# Patient Record
Sex: Male | Born: 1980 | Race: White | Hispanic: No | Smoking: Never smoker
Health system: Southern US, Community
[De-identification: ages and names within clinical notes are randomized; demographics above are authoritative.]

## PROBLEM LIST (undated history)

## (undated) DIAGNOSIS — T148XXA Other injury of unspecified body region, initial encounter: Secondary | ICD-10-CM

## (undated) DIAGNOSIS — R519 Headache, unspecified: Secondary | ICD-10-CM

## (undated) DIAGNOSIS — R569 Unspecified convulsions: Secondary | ICD-10-CM

## (undated) DIAGNOSIS — Z8489 Family history of other specified conditions: Secondary | ICD-10-CM

## (undated) DIAGNOSIS — R51 Headache: Secondary | ICD-10-CM

## (undated) HISTORY — PX: WISDOM TOOTH EXTRACTION: SHX21

---

## 2002-10-16 HISTORY — PX: INGUINAL HERNIA REPAIR: SUR1180

## 2012-05-29 ENCOUNTER — Encounter: Payer: Self-pay | Admitting: Family Medicine

## 2012-05-29 ENCOUNTER — Ambulatory Visit (INDEPENDENT_AMBULATORY_CARE_PROVIDER_SITE_OTHER): Payer: BC Managed Care – PPO | Admitting: Family Medicine

## 2012-05-29 VITALS — BP 136/92 | HR 88 | Ht 68.0 in | Wt 160.0 lb

## 2012-05-29 DIAGNOSIS — Z0000 Encounter for general adult medical examination without abnormal findings: Secondary | ICD-10-CM

## 2012-05-29 NOTE — Patient Instructions (Addendum)
Health Maintenance, Males A healthy lifestyle and preventative care can promote health and wellness.  Maintain regular health, dental, and eye exams.   Eat a healthy diet. Foods like vegetables, fruits, whole grains, low-fat dairy products, and lean protein foods contain the nutrients you need without too many calories. Decrease your intake of foods high in solid fats, added sugars, and salt. Get information about a proper diet from your caregiver, if necessary.   Regular physical exercise is one of the most important things you can do for your health. Most adults should get at least 150 minutes of moderate-intensity exercise (any activity that increases your heart rate and causes you to sweat) each week. In addition, most adults need muscle-strengthening exercises on 2 or more days a week.    Maintain a healthy weight. The body mass index (BMI) is a screening tool to identify possible weight problems. It provides an estimate of body fat based on height and weight. Your caregiver can help determine your BMI, and can help you achieve or maintain a healthy weight. For adults 20 years and older:   A BMI below 18.5 is considered underweight.   A BMI of 18.5 to 24.9 is normal.   A BMI of 25 to 29.9 is considered overweight.   A BMI of 30 and above is considered obese.   Maintain normal blood lipids and cholesterol by exercising and minimizing your intake of saturated fat. Eat a balanced diet with plenty of fruits and vegetables. Blood tests for lipids and cholesterol should begin at age 20 and be repeated every 5 years. If your lipid or cholesterol levels are high, you are over 50, or you are a high risk for heart disease, you may need your cholesterol levels checked more frequently.Ongoing high lipid and cholesterol levels should be treated with medicines, if diet and exercise are not effective.   If you smoke, find out from your caregiver how to quit. If you do not use tobacco, do not start.    If you choose to drink alcohol, do not exceed 2 drinks per day. One drink is considered to be 12 ounces (355 mL) of beer, 5 ounces (148 mL) of wine, or 1.5 ounces (44 mL) of liquor.   Avoid use of street drugs. Do not share needles with anyone. Ask for help if you need support or instructions about stopping the use of drugs.   High blood pressure causes heart disease and increases the risk of stroke. Blood pressure should be checked at least every 1 to 2 years. Ongoing high blood pressure should be treated with medicines if weight loss and exercise are not effective.   If you are 45 to 29 years old, ask your caregiver if you should take aspirin to prevent heart disease.   Diabetes screening involves taking a blood sample to check your fasting blood sugar level. This should be done once every 3 years, after age 45, if you are within normal weight and without risk factors for diabetes. Testing should be considered at a younger age or be carried out more frequently if you are overweight and have at least 1 risk factor for diabetes.   Colorectal cancer can be detected and often prevented. Most routine colorectal cancer screening begins at the age of 50 and continues through age 75. However, your caregiver may recommend screening at an earlier age if you have risk factors for colon cancer. On a yearly basis, your caregiver may provide home test kits to check for hidden   blood in the stool. Use of a small camera at the end of a tube, to directly examine the colon (sigmoidoscopy or colonoscopy), can detect the earliest forms of colorectal cancer. Talk to your caregiver about this at age 50, when routine screening begins. Direct examination of the colon should be repeated every 5 to 10 years through age 75, unless early forms of pre-cancerous polyps or small growths are found.   Hepatitis C blood testing is recommended for all people born from 1945 through 1965 and any individual with known risks for  hepatitis C.   Healthy men should no longer receive prostate-specific antigen (PSA) blood tests as part of routine cancer screening. Consult with your caregiver about prostate cancer screening.   Testicular cancer screening is not recommended for adolescents or adult males who have no symptoms. Screening includes self-exam, caregiver exam, and other screening tests. Consult with your caregiver about any symptoms you have or any concerns you have about testicular cancer.   Practice safe sex. Use condoms and avoid high-risk sexual practices to reduce the spread of sexually transmitted infections (STIs).   Use sunscreen with a sun protection factor (SPF) of 30 or greater. Apply sunscreen liberally and repeatedly throughout the day. You should seek shade when your shadow is shorter than you. Protect yourself by wearing long sleeves, pants, a wide-brimmed hat, and sunglasses year round, whenever you are outdoors.   Notify your caregiver of new moles or changes in moles, especially if there is a change in shape or color. Also notify your caregiver if a mole is larger than the size of a pencil eraser.   A one-time screening for abdominal aortic aneurysm (AAA) and surgical repair of large AAAs by sound wave imaging (ultrasonography) is recommended for ages 65 to 75 years who are current or former smokers.   Stay current with your immunizations.  Document Released: 03/30/2008 Document Revised: 09/21/2011 Document Reviewed: 02/27/2011 [employer] Patient Information 2012 [employer]. 

## 2012-05-29 NOTE — Progress Notes (Signed)
Patient ID: Shaun Wells, male   DOB: [birth year], 31 y.o.   MRN: [id number] SUBJECTIVE:  Shaun Wells is a 31 y.o. male presenting for his annual checkup. No current outpatient prescriptions on file.   Allergies: Penicillins   ROS:  Feeling well. No dyspnea or chest pain on exertion. No abdominal pain, change in bowel habits, black or bloody stools. No urinary tract or prostatic symptoms. No neurological complaints.  OBJECTIVE:  The patient appears well, alert, oriented x 3, in no distress.  BP 136/92  Pulse 88  Ht 5\' 8"  (1.727 m)  Wt 160 lb (72.576 kg)  BMI 24.33 kg/m2 ENT normal.  Neck supple. No adenopathy or thyromegaly. PERLA.  Lungs are clear, good air entry, no wheezes, rhonchi or rales.  Heart: S1 and S2 normal, no murmurs, regular rate and rhythm. Abdomen is soft without tenderness, guarding, mass or organomegaly.   Extremities show no edema, normal peripheral pulses.  Neurological is normal without focal findings.  ASSESSMENT:  healthy adult male  PLAN:  follow a low fat, low cholesterol diet, reduce salt in diet and cooking, continue current healthy lifestyle patterns and return for routine annual checkups

## 2012-05-31 ENCOUNTER — Ambulatory Visit: Payer: Self-pay | Admitting: Family Medicine

## 2013-06-30 ENCOUNTER — Ambulatory Visit (INDEPENDENT_AMBULATORY_CARE_PROVIDER_SITE_OTHER): Payer: BC Managed Care – PPO | Admitting: Family Medicine

## 2013-06-30 ENCOUNTER — Encounter: Payer: Self-pay | Admitting: Family Medicine

## 2013-06-30 VITALS — BP 130/80 | HR 78 | Temp 98.0°F | Ht 68.0 in | Wt 162.0 lb

## 2013-06-30 DIAGNOSIS — J069 Acute upper respiratory infection, unspecified: Secondary | ICD-10-CM

## 2013-06-30 DIAGNOSIS — R059 Cough, unspecified: Secondary | ICD-10-CM

## 2013-06-30 DIAGNOSIS — R05 Cough: Secondary | ICD-10-CM

## 2013-06-30 MED ORDER — FLUTICASONE PROPIONATE 50 MCG/ACT NA SUSP: 2.0000 | Freq: Every day | NASAL | Status: DC

## 2013-06-30 MED ORDER — BENZONATATE 100 MG PO CAPS: 100.0000 mg | ORAL_CAPSULE | Freq: Two times a day (BID) | ORAL | Status: DC | PRN

## 2013-06-30 NOTE — Progress Notes (Signed)
Patient ID: Shaun Wells    DOB: 1980/11/03, 32 y.o.   MRN: [id number] --- Subjective:  Shaun Wells is a 32 y.o.male who presents for same day visit for evaluation of cough:   - cough: started Friday a week ago, associated at the time with fever of 101.2. Fever resolved, but cough persisted. It is a productive cough of clear and green sputum, worst at night time when he lays back. He has not had any new fevers in the last few days. He denies any shortness of breath. He has a history of allergies and has had more congestion, rhinorrhea and itchy eyes recently. He had a frontal headache on Saturday but it has since resolved.  His son was sick with an ear infection 2 weeks ago. His wife was sick with a viral infection last week. She is since better.  He has tried benadryl which helped decongest his nasal passages. He took a coughing medicine which made him sleep but did not take the cough away.   ROS: see HPI Past Medical History: reviewed and updated medications and allergies. Social History: Tobacco: none  Objective: Filed Vitals:   06/30/13 1355  BP: 130/80  Pulse: 78  Temp: 98 F (36.7 C)    Physical Examination:   General appearance - alert, well appearing, and in no distress Ears - bilateral TM's and external ear canals normal Nose - mildly erythematous and congested nasal turbinates bilaterally Mouth - post nasal drip present Neck - supple, no significant adenopathy Chest - clear to auscultation, no wheezes, rales or rhonchi, symmetric air entry Heart - normal rate, regular rhythm, normal S1, S2, no murmurs

## 2013-06-30 NOTE — Patient Instructions (Addendum)
For the congestion, you can take afrin spray for 2 days. You can also take claritin for the allergies. I am sending a spray called flonase for allergies.  I am also sending a medicine called tessalon perles for the cough.    Upper Respiratory Infection, Adult An upper respiratory infection (URI) is also sometimes known as the common cold. The upper respiratory tract includes the nose, sinuses, throat, trachea, and bronchi. Bronchi are the airways leading to the lungs. Most people improve within 1 week, but symptoms can last up to 2 weeks. A residual cough may last even longer.  CAUSES Many different viruses can infect the tissues lining the upper respiratory tract. The tissues become irritated and inflamed and often become very moist. Mucus production is also common. A cold is contagious. You can easily spread the virus to others by oral contact. This includes kissing, sharing a glass, coughing, or sneezing. Touching your mouth or nose and then touching a surface, which is then touched by another person, can also spread the virus. SYMPTOMS  Symptoms typically develop 1 to 3 days after you come in contact with a cold virus. Symptoms vary from person to person. They may include:  Runny nose.  Sneezing.  Nasal congestion.  Sinus irritation.  Sore throat.  Loss of voice (laryngitis).  Cough.  Fatigue.  Muscle aches.  Loss of appetite.  Headache.  Low-grade fever. DIAGNOSIS  You might diagnose your own cold based on familiar symptoms, since most people get a cold 2 to 3 times a year. Your caregiver can confirm this based on your exam. Most importantly, your caregiver can check that your symptoms are not due to another disease such as strep throat, sinusitis, pneumonia, asthma, or epiglottitis. Blood tests, throat tests, and X-rays are not necessary to diagnose a common cold, but they may sometimes be helpful in excluding other more serious diseases. Your caregiver will decide if any  further tests are required. RISKS AND COMPLICATIONS  You may be at risk for a more severe case of the common cold if you smoke cigarettes, have chronic heart disease (such as heart failure) or lung disease (such as asthma), or if you have a weakened immune system. The very young and very old are also at risk for more serious infections. Bacterial sinusitis, middle ear infections, and bacterial pneumonia can complicate the common cold. The common cold can worsen asthma and chronic obstructive pulmonary disease (COPD). Sometimes, these complications can require emergency medical care and may be life-threatening. PREVENTION  The best way to protect against getting a cold is to practice good hygiene. Avoid oral or hand contact with people with cold symptoms. Wash your hands often if contact occurs. There is no clear evidence that vitamin C, vitamin E, echinacea, or exercise reduces the chance of developing a cold. However, it is always recommended to get plenty of rest and practice good nutrition. TREATMENT  Treatment is directed at relieving symptoms. There is no cure. Antibiotics are not effective, because the infection is caused by a virus, not by bacteria. Treatment may include:  Increased fluid intake. Sports drinks offer valuable electrolytes, sugars, and fluids.  Breathing heated mist or steam (vaporizer or shower).  Eating chicken soup or other clear broths, and maintaining good nutrition.  Getting plenty of rest.  Using gargles or lozenges for comfort.  Controlling fevers with ibuprofen or acetaminophen as directed by your caregiver.  Increasing usage of your inhaler if you have asthma. Zinc gel and zinc lozenges, taken in  the first 24 hours of the common cold, can shorten the duration and lessen the severity of symptoms. Pain medicines may help with fever, muscle aches, and throat pain. A variety of non-prescription medicines are available to treat congestion and runny nose. Your caregiver  can make recommendations and may suggest nasal or lung inhalers for other symptoms.  HOME CARE INSTRUCTIONS   Only take over-the-counter or prescription medicines for pain, discomfort, or fever as directed by your caregiver.  Use a warm mist humidifier or inhale steam from a shower to increase air moisture. This may keep secretions moist and make it easier to breathe.  Drink enough water and fluids to keep your urine clear or pale yellow.  Rest as needed.  Return to work when your temperature has returned to normal or as your caregiver advises. You may need to stay home longer to avoid infecting others. You can also use a face mask and careful hand washing to prevent spread of the virus. SEEK MEDICAL CARE IF:   After the first few days, you feel you are getting worse rather than better.  You need your caregiver's advice about medicines to control symptoms.  You develop chills, worsening shortness of breath, or brown or red sputum. These may be signs of pneumonia.  You develop yellow or brown nasal discharge or pain in the face, especially when you bend forward. These may be signs of sinusitis.  You develop a fever, swollen neck glands, pain with swallowing, or white areas in the back of your throat. These may be signs of strep throat. SEEK IMMEDIATE MEDICAL CARE IF:   You have a fever.  You develop severe or persistent headache, ear pain, sinus pain, or chest pain.  You develop wheezing, a prolonged cough, cough up blood, or have a change in your usual mucus (if you have chronic lung disease).  You develop sore muscles or a stiff neck. Document Released: 03/28/2001 Document Revised: 12/25/2011 Document Reviewed: 02/03/2011 [facility] Patient Information 2014 [place], [place].

## 2013-07-01 DIAGNOSIS — J069 Acute upper respiratory infection, unspecified: Secondary | ICD-10-CM | POA: Insufficient documentation

## 2013-07-01 NOTE — Assessment & Plan Note (Addendum)
Cough and congestion likely from upper respiratory virus. No evidence of bacterial infection.  - symptomatic treatment: treat cough with tessalon perles - congestion with afrin x2 days  - there also appears to be a component of allergies: start claritin and flonase

## 2014-02-12 ENCOUNTER — Ambulatory Visit (INDEPENDENT_AMBULATORY_CARE_PROVIDER_SITE_OTHER): Payer: BC Managed Care – PPO | Admitting: Family Medicine

## 2014-02-12 ENCOUNTER — Encounter: Payer: Self-pay | Admitting: Family Medicine

## 2014-02-12 VITALS — BP 152/88 | HR 85 | Temp 97.9°F | Wt 161.0 lb

## 2014-02-12 DIAGNOSIS — J0110 Acute frontal sinusitis, unspecified: Secondary | ICD-10-CM

## 2014-02-12 MED ORDER — AMOXICILLIN-POT CLAVULANATE 875-125 MG PO TABS: 1.0000 | ORAL_TABLET | Freq: Two times a day (BID) | ORAL | Status: DC

## 2014-02-12 NOTE — Progress Notes (Signed)
Subjective:     Patient ID: Shaun Wells, male   DOB: 12/03/1980, 33 y.o.   MRN: [id number]  HPI   - woke up Monday feeling badly with body aches and chills and had a temp to 101. Took tylenol and it went away - this AM woke up with chills, aches and sweats and temp of 101. Took tylenol and again fever resolved.   - has been having approx 2 weeks of sinus congestion - some headaches  - no tick exposures, tb exposures - his son has had similar symptoms last week. Was told he had a sinus infection and was placed on abx and now getting better.   No cough, nausea, vomiting, sob, cp, diarrhea, constipation.   Review of Systems See above    Objective:   Physical Exam  Constitutional: He appears well-developed and well-nourished.  HENT:  Head: Normocephalic and atraumatic.  Right Ear: Tympanic membrane is bulging (no effusion). No middle ear effusion.  Left Ear: Tympanic membrane is bulging (no effusion).  No middle ear effusion.  Nose: Mucosal edema present. Right sinus exhibits no maxillary sinus tenderness and no frontal sinus tenderness. Left sinus exhibits no maxillary sinus tenderness and no frontal sinus tenderness.  Mouth/Throat: Mucous membranes are normal. Posterior oropharyngeal erythema present. No posterior oropharyngeal edema.  Skin: He is not diaphoretic.       Assessment:     Sinusitis, acute frontal       Plan:     - currently likely viral infection based on sick contacts, duration of symptoms and overall feeling of well being.  - however, now two isolated fevers and symptoms that seem to be worsening - supportive care encouraged - rx for augmentin printed to take if symptoms do not improve or worsen over the next 3 days or fevers persist - chart states allergy to PCN however has taken amoxicillin with no difficulty in the past.    Vale HavenKeli L Henny Strauch, MD

## 2014-02-12 NOTE — Patient Instructions (Signed)

## 2016-01-25 DIAGNOSIS — F4321 Adjustment disorder with depressed mood: Secondary | ICD-10-CM | POA: Diagnosis not present

## 2016-02-04 DIAGNOSIS — F4321 Adjustment disorder with depressed mood: Secondary | ICD-10-CM | POA: Diagnosis not present

## 2016-03-03 DIAGNOSIS — F4323 Adjustment disorder with mixed anxiety and depressed mood: Secondary | ICD-10-CM | POA: Diagnosis not present

## 2016-03-24 DIAGNOSIS — F4321 Adjustment disorder with depressed mood: Secondary | ICD-10-CM | POA: Diagnosis not present

## 2016-03-27 DIAGNOSIS — R0789 Other chest pain: Secondary | ICD-10-CM | POA: Diagnosis not present

## 2016-08-11 DIAGNOSIS — H209 Unspecified iridocyclitis: Secondary | ICD-10-CM | POA: Diagnosis not present

## 2016-12-01 ENCOUNTER — Telehealth: Payer: Self-pay | Admitting: Family Medicine

## 2016-12-01 NOTE — Telephone Encounter (Signed)
Patient was last seen 01/2017. Overdue HM: tdap, HIV screening, flu vaccine.  Patient would like to complete a physical. He was unable to do so last time as he was unaware of having to abstain from all foods and liquids.  Patient described recent head injury at work with little follow-up other than standard cleaning and bandage.  Appointment scheduled for 12/20/2016 at 9am.

## 2016-12-20 ENCOUNTER — Ambulatory Visit (INDEPENDENT_AMBULATORY_CARE_PROVIDER_SITE_OTHER): Payer: BLUE CROSS/BLUE SHIELD | Admitting: Family Medicine

## 2016-12-20 ENCOUNTER — Encounter: Payer: Self-pay | Admitting: Family Medicine

## 2016-12-20 VITALS — BP 108/72 | HR 68 | Temp 98.3°F | Ht 70.0 in | Wt 169.0 lb

## 2016-12-20 DIAGNOSIS — G8929 Other chronic pain: Secondary | ICD-10-CM | POA: Diagnosis not present

## 2016-12-20 DIAGNOSIS — M25562 Pain in left knee: Secondary | ICD-10-CM

## 2016-12-20 DIAGNOSIS — M25561 Pain in right knee: Secondary | ICD-10-CM | POA: Diagnosis not present

## 2016-12-20 DIAGNOSIS — Z20828 Contact with and (suspected) exposure to other viral communicable diseases: Secondary | ICD-10-CM

## 2016-12-20 DIAGNOSIS — Z0000 Encounter for general adult medical examination without abnormal findings: Secondary | ICD-10-CM | POA: Insufficient documentation

## 2016-12-20 DIAGNOSIS — Z23 Encounter for immunization: Secondary | ICD-10-CM | POA: Diagnosis not present

## 2016-12-20 LAB — LIPID PANEL
CHOL/HDL RATIO: 4.1 ratio (ref <5.0)
Cholesterol: 163 mg/dL (ref <200)
HDL: 40 mg/dL — AB (ref >40)
LDL CALC: 102 mg/dL — AB (ref <100)
Triglycerides: 107 mg/dL (ref <150)
VLDL: 21 mg/dL (ref <30)

## 2016-12-20 LAB — BASIC METABOLIC PANEL WITH GFR
BUN: 15 mg/dL (ref 7–25)
CHLORIDE: 105 mmol/L (ref 98–110)
CO2: 29 mmol/L (ref 20–31)
Calcium: 9.2 mg/dL (ref 8.6–10.3)
Creat: 0.8 mg/dL (ref 0.60–1.35)
GFR, Est African American: >89 mL/min (ref >=60)
GFR, Est Non African American: >89 mL/min (ref >=60)
GLUCOSE: 94 mg/dL (ref 65–99)
POTASSIUM: 4 mmol/L (ref 3.5–5.3)
Sodium: 140 mmol/L (ref 135–146)

## 2016-12-20 LAB — TSH: TSH: 0.93 m[IU]/L (ref 0.40–4.50)

## 2016-12-20 LAB — CBC
HCT: 46.3 % (ref 38.5–50.0)
Hemoglobin: 15.8 g/dL (ref 13.2–17.1)
MCH: 30.7 pg (ref 27.0–33.0)
MCHC: 34.1 g/dL (ref 32.0–36.0)
MCV: 89.9 fL (ref 80.0–100.0)
MPV: 10.3 fL (ref 7.5–12.5)
Platelets: 205 10*3/uL (ref 140–400)
RBC: 5.15 MIL/uL (ref 4.20–5.80)
RDW: 13.4 % (ref 11.0–15.0)
WBC: 4.6 10*3/uL (ref 3.8–10.8)

## 2016-12-20 NOTE — Progress Notes (Signed)
Mickie HillierIan Maxen Rowland, MD, MS Phone: [phone]  Subjective:  CC -- Annual Physical;  Pt reports he feels well overall. He has had some bilateral knee pain that is not inhibit his activities of daily living. He states that this knee pain is achy in nature. It feels "deep". Pain does not radiate. Pain is exacerbated while kneeling or crouching. 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 Stairs do not make this pain any worse. He denies any mechanical symptoms. He denies any previous injuries to his knees. Denies any swelling or erythema.  Cardiovascular: - Dx Hypertension: no  - Dx Hyperlipidemia: no - Dx Obesity: no  - Physical Activity: no, Work is a lot of physical activity.   - Diabetes: no   Cancer: Colorectal >> Colonoscopy: no  Lung >> Tobacco Use: no  - But chews tobacco >> been doing this for 6-7 years.   - If so, previous Low-Dose CT screen: no  Prostate >> Interested in DRE and/or PSA: no  Skin >> Suspicious lesions: no   Social: Alcohol Use: no  Tobacco Use: no   - Interested in Quitting: n/a  Other Drugs: no  Risky Sexual Behavior: no  Depression: no   - PHQ9 score: 0 Support and Life at Home: yes   Other: Osteoporosis: no  Zoster Vaccine: no Flu Vaccine: no, declined  Pneumonia Vaccine: no   ROS- denies recent fevers, chills, headache, blurred vision, lightheadedness, dizziness, dysphagia, chest pain, shortness of breath, nausea, vomiting, diarrhea, numbness, weakness, or paresthesias.  Past Medical History Patient Active Problem List   Diagnosis Date Noted  . Preventative health care 12/20/2016  . Bilateral chronic knee pain 12/20/2016    Medications- reviewed and updated No current outpatient prescriptions on file.   No current facility-administered medications for this visit.     Objective: BP 108/72   Pulse 68   Temp 98.3 F (36.8 C) (Oral)   Ht 5\' 10"  (1.778 m)   Wt 169 lb (76.7 kg)   SpO2 98%    BMI 24.25 kg/m  Gen: NAD, alert, cooperative with exam HEENT: NCAT, EOMI, PERRL CV: RRR, good S1/S2, no murmur Resp: CTABL, no wheezes, non-labored Abd: Soft, Non Tender, Non Distended, BS present, no guarding or organomegaly Ext: No edema, warm Neuro: Alert and oriented, No gross deficits   Assessment/Plan:  Preventative health care Patient is here for an annual physical. He states that he has been doing well. He occasionally has some bilateral knee pain. 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 Pain does not inhibit his activities of daily living. - Discussed regular exercise. - Patient chews tobacco; counseled patient on quitting this habit. Patient stated his understanding and said he would likely do this in the near future. - Follow-up one year - Labs obtained today.  Bilateral chronic knee pain Minimal symptoms at this time. Intermittent duration. No significant effect on his daily life. Patient's occupation puts him at a greater risk for patellar osteoarthritis due to regular crouching and kneeling for prolonged periods of time during his workday. - Discussed Aleve as needed for pain - Discussed ice as needed - Kneepads during his workday. - Quad strengthening may benefit him long-term.   Orders Placed This Encounter  Procedures  . Tdap vaccine greater than or equal to 7yo IM  . HIV antibody  . CBC  . BASIC METABOLIC PANEL WITH GFR  . TSH  . Lipid panel    Melanee SpryIan  Sunny Schlein, MD,MS,  PGY3 12/20/2016 5:50 PM

## 2016-12-20 NOTE — Assessment & Plan Note (Signed)
Minimal symptoms at this time. Intermittent duration. No significant effect on his daily life. Patient's occupation puts him at a greater risk for patellar osteoarthritis due to regular crouching and kneeling for prolonged periods of time during his workday. - Discussed Aleve as needed for pain - Discussed ice as needed - Kneepads during his workday. - Quad strengthening may benefit him long-term.

## 2016-12-20 NOTE — Assessment & Plan Note (Signed)
Patient is here for an annual physical. He states that he has been doing well. He occasionally has some bilateral knee pain. 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 Pain does not inhibit his activities of daily living. - Discussed regular exercise. - Patient chews tobacco; counseled patient on quitting this habit. Patient stated his understanding and said he would likely do this in the near future. - Follow-up one year - Labs obtained today.

## 2016-12-20 NOTE — Patient Instructions (Signed)
 Health Maintenance, Male A healthy lifestyle and preventive care is important for your health and wellness. Ask your health care provider about what schedule of regular examinations is right for you. What should I know about weight and diet?  Eat a Healthy Diet  Eat plenty of vegetables, fruits, whole grains, low-fat dairy products, and lean protein.  Do not eat a lot of foods high in solid fats, added sugars, or salt. Maintain a Healthy Weight  Regular exercise can help you achieve or maintain a healthy weight. You should:  Do at least 150 minutes of exercise each week. The exercise should increase your heart rate and make you sweat (moderate-intensity exercise).  Do strength-training exercises at least twice a week. Watch Your Levels of Cholesterol and Blood Lipids  Have your blood tested for lipids and cholesterol every 5 years starting at 35 years of age. If you are at high risk for heart disease, you should start having your blood tested when you are 34 years old. You may need to have your cholesterol levels checked more often if:  Your lipid or cholesterol levels are high.  You are older than 34 years of age.  You are at high risk for heart disease. What should I know about cancer screening? Many types of cancers can be detected early and may often be prevented. Lung Cancer  You should be screened every year for lung cancer if:  You are a current smoker who has smoked for at least 30 years.  You are a former smoker who has quit within the past 15 years.  Talk to your health care provider about your screening options, when you should start screening, and how often you should be screened. Colorectal Cancer  Routine colorectal cancer screening usually begins at 34 years of age and should be repeated every 5-10 years until you are 34 years old. You may need to be screened more often if early forms of precancerous polyps or small growths are found. Your health care provider  may recommend screening at an earlier age if you have risk factors for colon cancer.  Your health care provider may recommend using home test kits to check for hidden blood in the stool.  A small camera at the end of a tube can be used to examine your colon (sigmoidoscopy or colonoscopy). This checks for the earliest forms of colorectal cancer. Prostate and Testicular Cancer  Depending on your age and overall health, your health care provider may do certain tests to screen for prostate and testicular cancer.  Talk to your health care provider about any symptoms or concerns you have about testicular or prostate cancer. Skin Cancer  Check your skin from head to toe regularly.  Tell your health care provider about any new moles or changes in moles, especially if:  There is a change in a mole's size, shape, or color.  You have a mole that is larger than a pencil eraser.  Always use sunscreen. Apply sunscreen liberally and repeat throughout the day.  Protect yourself by wearing long sleeves, pants, a wide-brimmed hat, and sunglasses when outside. What should I know about heart disease, diabetes, and high blood pressure?  If you are 18-39 years of age, have your blood pressure checked every 3-5 years. If you are 40 years of age or older, have your blood pressure checked every year. You should have your blood pressure measured twice-once when you are at a hospital or clinic, and once when you are not at   a hospital or clinic. Record the average of the two measurements. To check your blood pressure when you are not at a hospital or clinic, you can use:  An automated blood pressure machine at a pharmacy.  A home blood pressure monitor.  Talk to your health care provider about your target blood pressure.  If you are between 45-79 years old, ask your health care provider if you should take aspirin to prevent heart disease.  Have regular diabetes screenings by checking your fasting blood sugar  level.  If you are at a normal weight and have a low risk for diabetes, have this test once every three years after the age of 45.  If you are overweight and have a high risk for diabetes, consider being tested at a younger age or more often.  A one-time screening for abdominal aortic aneurysm (AAA) by ultrasound is recommended for men aged 65-75 years who are current or former smokers. What should I know about preventing infection? Hepatitis B  If you have a higher risk for hepatitis B, you should be screened for this virus. Talk with your health care provider to find out if you are at risk for hepatitis B infection. Hepatitis C  Blood testing is recommended for:  Everyone born from 1945 through 1965.  Anyone with known risk factors for hepatitis C. Sexually Transmitted Diseases (STDs)  You should be screened each year for STDs including gonorrhea and chlamydia if:  You are sexually active and are younger than 34 years of age.  You are older than 34 years of age and your health care provider tells you that you are at risk for this type of infection.  Your sexual activity has changed since you were last screened and you are at an increased risk for chlamydia or gonorrhea. Ask your health care provider if you are at risk.  Talk with your health care provider about whether you are at high risk of being infected with HIV. Your health care provider may recommend a prescription medicine to help prevent HIV infection. What else can I do?  Schedule regular health, dental, and eye exams.  Stay current with your vaccines (immunizations).  Do not use any tobacco products, such as cigarettes, chewing tobacco, and e-cigarettes. If you need help quitting, ask your health care provider.  Limit alcohol intake to no more than 2 drinks per day. One drink equals 12 ounces of beer, 5 ounces of wine, or 1 ounces of hard liquor.  Do not use street drugs.  Do not share needles.  Ask your health  care provider for help if you need support or information about quitting drugs.  Tell your health care provider if you often feel depressed.  Tell your health care provider if you have ever been abused or do not feel safe at home. This information is not intended to replace advice given to you by your health care provider. Make sure you discuss any questions you have with your health care provider. Document Released: 03/30/2008 Document Revised: 05/31/2016 Document Reviewed: 07/06/2015 Elsevier Interactive Patient Education  2017 Elsevier Inc.  

## 2016-12-21 LAB — HIV ANTIBODY (ROUTINE TESTING W REFLEX): HIV 1&2 Ab, 4th Generation: NONREACTIVE

## 2017-01-01 ENCOUNTER — Encounter: Payer: Self-pay | Admitting: Family Medicine

## 2017-10-19 DIAGNOSIS — H16293 Other keratoconjunctivitis, bilateral: Secondary | ICD-10-CM | POA: Diagnosis not present

## 2017-12-26 DIAGNOSIS — J069 Acute upper respiratory infection, unspecified: Secondary | ICD-10-CM | POA: Diagnosis not present

## 2018-04-08 DIAGNOSIS — S52612K Displaced fracture of left ulna styloid process, subsequent encounter for closed fracture with nonunion: Secondary | ICD-10-CM | POA: Diagnosis not present

## 2018-04-08 DIAGNOSIS — S6992XA Unspecified injury of left wrist, hand and finger(s), initial encounter: Secondary | ICD-10-CM | POA: Diagnosis not present

## 2018-04-08 DIAGNOSIS — S52612A Displaced fracture of left ulna styloid process, initial encounter for closed fracture: Secondary | ICD-10-CM | POA: Diagnosis not present

## 2018-04-08 DIAGNOSIS — M25532 Pain in left wrist: Secondary | ICD-10-CM | POA: Diagnosis not present

## 2018-04-08 DIAGNOSIS — M50821 Other cervical disc disorders at C4-C5 level: Secondary | ICD-10-CM | POA: Diagnosis not present

## 2018-04-08 DIAGNOSIS — M542 Cervicalgia: Secondary | ICD-10-CM | POA: Diagnosis not present

## 2018-04-09 DIAGNOSIS — S161XXA Strain of muscle, fascia and tendon at neck level, initial encounter: Secondary | ICD-10-CM | POA: Diagnosis not present

## 2018-04-09 DIAGNOSIS — M25532 Pain in left wrist: Secondary | ICD-10-CM | POA: Diagnosis not present

## 2018-04-22 DIAGNOSIS — S40011A Contusion of right shoulder, initial encounter: Secondary | ICD-10-CM | POA: Diagnosis not present

## 2018-04-22 DIAGNOSIS — M9907 Segmental and somatic dysfunction of upper extremity: Secondary | ICD-10-CM | POA: Diagnosis not present

## 2018-04-22 DIAGNOSIS — M9901 Segmental and somatic dysfunction of cervical region: Secondary | ICD-10-CM | POA: Diagnosis not present

## 2018-04-22 DIAGNOSIS — G8911 Acute pain due to trauma: Secondary | ICD-10-CM | POA: Diagnosis not present

## 2018-04-22 DIAGNOSIS — M40292 Other kyphosis, cervical region: Secondary | ICD-10-CM | POA: Diagnosis not present

## 2018-04-22 DIAGNOSIS — M62830 Muscle spasm of back: Secondary | ICD-10-CM | POA: Diagnosis not present

## 2018-04-23 DIAGNOSIS — M25511 Pain in right shoulder: Secondary | ICD-10-CM | POA: Diagnosis not present

## 2018-04-23 DIAGNOSIS — M25532 Pain in left wrist: Secondary | ICD-10-CM | POA: Diagnosis not present

## 2018-04-23 DIAGNOSIS — S161XXA Strain of muscle, fascia and tendon at neck level, initial encounter: Secondary | ICD-10-CM | POA: Diagnosis not present

## 2018-04-25 DIAGNOSIS — S161XXD Strain of muscle, fascia and tendon at neck level, subsequent encounter: Secondary | ICD-10-CM | POA: Diagnosis not present

## 2018-04-25 DIAGNOSIS — M25511 Pain in right shoulder: Secondary | ICD-10-CM | POA: Diagnosis not present

## 2018-04-25 DIAGNOSIS — S161XXA Strain of muscle, fascia and tendon at neck level, initial encounter: Secondary | ICD-10-CM | POA: Diagnosis not present

## 2018-04-26 ENCOUNTER — Ambulatory Visit
Admission: RE | Admit: 2018-04-26 | Discharge: 2018-04-26 | Disposition: A | Discharge status: Home / Self Care | Payer: BLUE CROSS/BLUE SHIELD | Source: Ambulatory Visit | Attending: Physician Assistant | Admitting: Physician Assistant

## 2018-04-26 ENCOUNTER — Other Ambulatory Visit: Payer: Self-pay | Admitting: Physician Assistant

## 2018-04-26 ENCOUNTER — Other Ambulatory Visit: Payer: Self-pay | Admitting: Internal Medicine

## 2018-04-26 DIAGNOSIS — M542 Cervicalgia: Secondary | ICD-10-CM | POA: Diagnosis not present

## 2018-04-26 DIAGNOSIS — S129XXA Fracture of neck, unspecified, initial encounter: Secondary | ICD-10-CM | POA: Diagnosis not present

## 2018-04-26 DIAGNOSIS — S129XXD Fracture of neck, unspecified, subsequent encounter: Secondary | ICD-10-CM | POA: Diagnosis not present

## 2018-04-26 DIAGNOSIS — S199XXA Unspecified injury of neck, initial encounter: Secondary | ICD-10-CM | POA: Diagnosis not present

## 2018-04-26 DIAGNOSIS — N50819 Testicular pain, unspecified: Secondary | ICD-10-CM

## 2018-04-29 DIAGNOSIS — M542 Cervicalgia: Secondary | ICD-10-CM | POA: Diagnosis not present

## 2018-04-30 ENCOUNTER — Other Ambulatory Visit: Payer: Self-pay

## 2018-04-30 ENCOUNTER — Other Ambulatory Visit: Payer: Self-pay | Admitting: Physician Assistant

## 2018-04-30 ENCOUNTER — Encounter (HOSPITAL_COMMUNITY): Payer: Self-pay | Admitting: *Deleted

## 2018-04-30 DIAGNOSIS — S129XXD Fracture of neck, unspecified, subsequent encounter: Secondary | ICD-10-CM
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
## 2018-04-30 NOTE — Progress Notes (Signed)
Pt denies SOB, chest pain, and being under the care of a cardiologist. Pt denies having a stress test, echo and cardiac cath. Pt denies having an EKG and chest x ray. Pt denies recent labs. Pt made aware to stop taking  Aspirin, vitamins, fish oil and herbal medications. Do not take any NSAIDs ie: Ibuprofen, Advil, Naproxen (Aleve), Motrin, BC and Goody powder. Pt verbalized understanding of all pre-op instructions.

## 2018-05-01 ENCOUNTER — Observation Stay (HOSPITAL_COMMUNITY)
Admission: RE | Admit: 2018-05-01 | Discharge: 2018-05-02 | Disposition: A | Discharge status: Home / Self Care | Payer: BLUE CROSS/BLUE SHIELD | Source: Ambulatory Visit | Attending: Orthopedic Surgery | Admitting: Orthopedic Surgery

## 2018-05-01 ENCOUNTER — Ambulatory Visit (HOSPITAL_COMMUNITY): Payer: BLUE CROSS/BLUE SHIELD | Admitting: Certified Registered Nurse Anesthetist

## 2018-05-01 ENCOUNTER — Ambulatory Visit (HOSPITAL_COMMUNITY): Payer: BLUE CROSS/BLUE SHIELD

## 2018-05-01 ENCOUNTER — Encounter (HOSPITAL_COMMUNITY): Payer: Self-pay

## 2018-05-01 ENCOUNTER — Ambulatory Visit (HOSPITAL_COMMUNITY)
Admission: RE | Disposition: A | Discharge status: Home / Self Care | Payer: Self-pay | Source: Ambulatory Visit | Attending: Orthopedic Surgery

## 2018-05-01 DIAGNOSIS — M5412 Radiculopathy, cervical region: Secondary | ICD-10-CM | POA: Diagnosis not present

## 2018-05-01 DIAGNOSIS — S12300A Unspecified displaced fracture of fourth cervical vertebra, initial encounter for closed fracture: Secondary | ICD-10-CM | POA: Diagnosis not present

## 2018-05-01 DIAGNOSIS — M2578 Osteophyte, vertebrae: Secondary | ICD-10-CM | POA: Insufficient documentation

## 2018-05-01 DIAGNOSIS — Z981 Arthrodesis status: Secondary | ICD-10-CM | POA: Diagnosis not present

## 2018-05-01 DIAGNOSIS — S12400A Unspecified displaced fracture of fifth cervical vertebra, initial encounter for closed fracture: Secondary | ICD-10-CM | POA: Diagnosis not present

## 2018-05-01 DIAGNOSIS — Z419 Encounter for procedure for purposes other than remedying health state, unspecified: Secondary | ICD-10-CM

## 2018-05-01 DIAGNOSIS — M50121 Cervical disc disorder at C4-C5 level with radiculopathy: Secondary | ICD-10-CM | POA: Diagnosis not present

## 2018-05-01 HISTORY — DX: Unspecified convulsions: R56.9

## 2018-05-01 HISTORY — DX: Headache: R51

## 2018-05-01 HISTORY — PX: ANTERIOR CERVICAL DECOMP/DISCECTOMY FUSION: SHX1161

## 2018-05-01 HISTORY — DX: Family history of other specified conditions: Z84.89

## 2018-05-01 HISTORY — DX: Other injury of unspecified body region, initial encounter: T14.8XXA

## 2018-05-01 HISTORY — DX: Headache, unspecified: R51.9

## 2018-05-01 LAB — CBC
HCT: 49.4 % (ref 39.0–52.0)
Hemoglobin: 16.7 g/dL (ref 13.0–17.0)
MCH: 30.1 pg (ref 26.0–34.0)
MCHC: 33.8 g/dL (ref 30.0–36.0)
MCV: 89.2 fL (ref 78.0–100.0)
Platelets: 186 10*3/uL (ref 150–400)
RBC: 5.54 MIL/uL (ref 4.22–5.81)
RDW: 11.8 % (ref 11.5–15.5)
WBC: 4.5 10*3/uL (ref 4.0–10.5)

## 2018-05-01 SURGERY — ANTERIOR CERVICAL DECOMPRESSION/DISCECTOMY FUSION 1 LEVEL
Anesthesia: General | Site: Neck

## 2018-05-01 MED ORDER — ACETAMINOPHEN 10 MG/ML IV SOLN
INTRAVENOUS | Status: AC
  Filled 2018-05-01: qty 100

## 2018-05-01 MED ORDER — DIPHENHYDRAMINE HCL 50 MG/ML IJ SOLN
INTRAMUSCULAR | Status: DC | PRN
  Administered 2018-05-01: 12.5 mg via INTRAVENOUS

## 2018-05-01 MED ORDER — VANCOMYCIN HCL IN DEXTROSE 1-5 GM/200ML-% IV SOLN
1000.0000 mg | Freq: Once | INTRAVENOUS | Status: AC
  Administered 2018-05-01: 1000 mg via INTRAVENOUS
  Filled 2018-05-01: qty 200

## 2018-05-01 MED ORDER — ONDANSETRON HCL 4 MG/2ML IJ SOLN: 4.0000 mg | Freq: Four times a day (QID) | INTRAMUSCULAR | Status: DC | PRN

## 2018-05-01 MED ORDER — VANCOMYCIN HCL IN DEXTROSE 1-5 GM/200ML-% IV SOLN
1000.0000 mg | INTRAVENOUS | Status: AC
  Administered 2018-05-01: 1000 mg via INTRAVENOUS
  Filled 2018-05-01: qty 200

## 2018-05-01 MED ORDER — SUGAMMADEX SODIUM 200 MG/2ML IV SOLN
INTRAVENOUS | Status: AC
  Filled 2018-05-01: qty 2

## 2018-05-01 MED ORDER — 0.9 % SODIUM CHLORIDE (POUR BTL) OPTIME
TOPICAL | Status: DC | PRN
  Administered 2018-05-01 (×2): 1000 mL

## 2018-05-01 MED ORDER — SUGAMMADEX SODIUM 200 MG/2ML IV SOLN
INTRAVENOUS | Status: DC | PRN
  Administered 2018-05-01: 200 mg via INTRAVENOUS

## 2018-05-01 MED ORDER — LIDOCAINE 2% (20 MG/ML) 5 ML SYRINGE
INTRAMUSCULAR | Status: DC | PRN
  Administered 2018-05-01: 80 mg via INTRAVENOUS

## 2018-05-01 MED ORDER — LIDOCAINE 2% (20 MG/ML) 5 ML SYRINGE
INTRAMUSCULAR | Status: AC
  Filled 2018-05-01: qty 5

## 2018-05-01 MED ORDER — SODIUM CHLORIDE 0.9% FLUSH: 3.0000 mL | Freq: Two times a day (BID) | INTRAVENOUS | Status: DC

## 2018-05-01 MED ORDER — ONDANSETRON 4 MG PO TBDP: 4.0000 mg | ORAL_TABLET | Freq: Three times a day (TID) | ORAL | 0 refills | Status: DC | PRN

## 2018-05-01 MED ORDER — DIPHENHYDRAMINE HCL 50 MG/ML IJ SOLN
INTRAMUSCULAR | Status: AC
  Filled 2018-05-01: qty 1

## 2018-05-01 MED ORDER — MORPHINE SULFATE (PF) 2 MG/ML IV SOLN: 1.0000 mg | INTRAVENOUS | Status: DC | PRN

## 2018-05-01 MED ORDER — DEXAMETHASONE SODIUM PHOSPHATE 10 MG/ML IJ SOLN
INTRAMUSCULAR | Status: AC
  Filled 2018-05-01: qty 1

## 2018-05-01 MED ORDER — THROMBIN 20000 UNITS EX SOLR
CUTANEOUS | Status: DC | PRN
  Administered 2018-05-01: 20 mL

## 2018-05-01 MED ORDER — OXYCODONE HCL 5 MG PO TABS: 5.0000 mg | ORAL_TABLET | Freq: Once | ORAL | Status: DC | PRN

## 2018-05-01 MED ORDER — ROCURONIUM BROMIDE 10 MG/ML (PF) SYRINGE
PREFILLED_SYRINGE | INTRAVENOUS | Status: DC | PRN
  Administered 2018-05-01: 20 mg via INTRAVENOUS
  Administered 2018-05-01: 60 mg via INTRAVENOUS

## 2018-05-01 MED ORDER — METHOCARBAMOL 1000 MG/10ML IJ SOLN
500.0000 mg | Freq: Four times a day (QID) | INTRAVENOUS | Status: DC | PRN
  Filled 2018-05-01: qty 5

## 2018-05-01 MED ORDER — LACTATED RINGERS IV SOLN
INTRAVENOUS | Status: DC
  Administered 2018-05-01 (×2): via INTRAVENOUS

## 2018-05-01 MED ORDER — MIDAZOLAM HCL 2 MG/2ML IJ SOLN
INTRAMUSCULAR | Status: AC
  Filled 2018-05-01: qty 2

## 2018-05-01 MED ORDER — PROPOFOL 10 MG/ML IV BOLUS
INTRAVENOUS | Status: DC | PRN
  Administered 2018-05-01: 150 mg via INTRAVENOUS

## 2018-05-01 MED ORDER — OXYCODONE HCL 5 MG PO TABS: 5.0000 mg | ORAL_TABLET | ORAL | Status: DC | PRN

## 2018-05-01 MED ORDER — ACETAMINOPHEN 325 MG PO TABS: 650.0000 mg | ORAL_TABLET | ORAL | Status: DC | PRN

## 2018-05-01 MED ORDER — HYDROMORPHONE HCL 1 MG/ML IJ SOLN: 0.2500 mg | INTRAMUSCULAR | Status: DC | PRN

## 2018-05-01 MED ORDER — LACTATED RINGERS IV SOLN: INTRAVENOUS | Status: DC

## 2018-05-01 MED ORDER — MIDAZOLAM HCL 2 MG/2ML IJ SOLN
INTRAMUSCULAR | Status: DC | PRN
  Administered 2018-05-01: 2 mg via INTRAVENOUS

## 2018-05-01 MED ORDER — DOCUSATE SODIUM 100 MG PO CAPS
100.0000 mg | ORAL_CAPSULE | Freq: Two times a day (BID) | ORAL | Status: DC
  Administered 2018-05-01: 100 mg via ORAL
  Filled 2018-05-01: qty 1

## 2018-05-01 MED ORDER — ACETAMINOPHEN 10 MG/ML IV SOLN
INTRAVENOUS | Status: DC | PRN
  Administered 2018-05-01: 1000 mg via INTRAVENOUS

## 2018-05-01 MED ORDER — POLYETHYLENE GLYCOL 3350 17 G PO PACK: 17.0000 g | PACK | Freq: Every day | ORAL | Status: DC | PRN

## 2018-05-01 MED ORDER — METHOCARBAMOL 500 MG PO TABS: 500.0000 mg | ORAL_TABLET | Freq: Three times a day (TID) | ORAL | 0 refills | Status: DC

## 2018-05-01 MED ORDER — SODIUM CHLORIDE 0.9% FLUSH: 3.0000 mL | INTRAVENOUS | Status: DC | PRN

## 2018-05-01 MED ORDER — ONDANSETRON HCL 4 MG/2ML IJ SOLN
INTRAMUSCULAR | Status: AC
  Filled 2018-05-01: qty 2

## 2018-05-01 MED ORDER — MENTHOL 3 MG MT LOZG
1.0000 | LOZENGE | OROMUCOSAL | Status: DC | PRN
  Filled 2018-05-01: qty 9

## 2018-05-01 MED ORDER — ROCURONIUM BROMIDE 10 MG/ML (PF) SYRINGE
PREFILLED_SYRINGE | INTRAVENOUS | Status: AC
  Filled 2018-05-01: qty 10

## 2018-05-01 MED ORDER — OXYCODONE HCL 5 MG PO TABS
10.0000 mg | ORAL_TABLET | ORAL | Status: DC | PRN
  Administered 2018-05-01: 10 mg via ORAL
  Filled 2018-05-01: qty 2

## 2018-05-01 MED ORDER — DEXMEDETOMIDINE HCL IN NACL 200 MCG/50ML IV SOLN
INTRAVENOUS | Status: DC | PRN
  Administered 2018-05-01: 8 ug via INTRAVENOUS
  Administered 2018-05-01 (×2): 12 ug via INTRAVENOUS

## 2018-05-01 MED ORDER — MEPERIDINE HCL 50 MG/ML IJ SOLN: 6.2500 mg | INTRAMUSCULAR | Status: DC | PRN

## 2018-05-01 MED ORDER — FENTANYL CITRATE (PF) 250 MCG/5ML IJ SOLN
INTRAMUSCULAR | Status: DC | PRN
  Administered 2018-05-01 (×2): 50 ug via INTRAVENOUS
  Administered 2018-05-01: 100 ug via INTRAVENOUS
  Administered 2018-05-01: 50 ug via INTRAVENOUS

## 2018-05-01 MED ORDER — THROMBIN (RECOMBINANT) 20000 UNITS EX SOLR
CUTANEOUS | Status: AC
  Filled 2018-05-01: qty 20000

## 2018-05-01 MED ORDER — HEMOSTATIC AGENTS (NO CHARGE) OPTIME
TOPICAL | Status: DC | PRN
  Administered 2018-05-01: 1 via TOPICAL

## 2018-05-01 MED ORDER — ONDANSETRON HCL 4 MG/2ML IJ SOLN
INTRAMUSCULAR | Status: DC | PRN
  Administered 2018-05-01: 4 mg via INTRAVENOUS

## 2018-05-01 MED ORDER — ONDANSETRON HCL 4 MG PO TABS: 4.0000 mg | ORAL_TABLET | Freq: Four times a day (QID) | ORAL | Status: DC | PRN

## 2018-05-01 MED ORDER — BUPIVACAINE-EPINEPHRINE 0.25% -1:200000 IJ SOLN
INTRAMUSCULAR | Status: DC | PRN
  Administered 2018-05-01: 9.5 mL

## 2018-05-01 MED ORDER — BUPIVACAINE-EPINEPHRINE (PF) 0.25% -1:200000 IJ SOLN
INTRAMUSCULAR | Status: AC
  Filled 2018-05-01: qty 30

## 2018-05-01 MED ORDER — MAGNESIUM CITRATE PO SOLN: 1.0000 | Freq: Once | ORAL | Status: DC | PRN

## 2018-05-01 MED ORDER — FENTANYL CITRATE (PF) 250 MCG/5ML IJ SOLN
INTRAMUSCULAR | Status: AC
  Filled 2018-05-01: qty 5

## 2018-05-01 MED ORDER — METHOCARBAMOL 500 MG PO TABS
500.0000 mg | ORAL_TABLET | Freq: Four times a day (QID) | ORAL | Status: DC | PRN
  Administered 2018-05-01: 500 mg via ORAL
  Filled 2018-05-01: qty 1

## 2018-05-01 MED ORDER — PHENOL 1.4 % MT LIQD: 1.0000 | OROMUCOSAL | Status: DC | PRN

## 2018-05-01 MED ORDER — PROMETHAZINE HCL 25 MG/ML IJ SOLN: 6.2500 mg | INTRAMUSCULAR | Status: DC | PRN

## 2018-05-01 MED ORDER — OXYCODONE HCL 5 MG/5ML PO SOLN: 5.0000 mg | Freq: Once | ORAL | Status: DC | PRN

## 2018-05-01 MED ORDER — ACETAMINOPHEN 650 MG RE SUPP: 650.0000 mg | RECTAL | Status: DC | PRN

## 2018-05-01 MED ORDER — DEXAMETHASONE SODIUM PHOSPHATE 10 MG/ML IJ SOLN
INTRAMUSCULAR | Status: DC | PRN
  Administered 2018-05-01: 10 mg via INTRAVENOUS

## 2018-05-01 MED ORDER — OXYCODONE HCL 5 MG PO TABS: 5.0000 mg | ORAL_TABLET | ORAL | 0 refills | Status: AC | PRN

## 2018-05-01 SURGICAL SUPPLY — 58 items
BONE VIVIGEN FORMABLE 1.3CC (Bone Implant) ×2 IMPLANT
CABLE BIPOLOR RESECTION CORD (MISCELLANEOUS) ×2 IMPLANT
CAGE LORDOTIC TC SM 8 6D (Cage) ×2 IMPLANT
CANISTER SUCT 3000ML PPV (MISCELLANEOUS) ×2 IMPLANT
CLSR STERI-STRIP ANTIMIC 1/2X4 (GAUZE/BANDAGES/DRESSINGS) ×2 IMPLANT
COVER MAYO STAND STRL (DRAPES) ×6 IMPLANT
COVER SURGICAL LIGHT HANDLE (MISCELLANEOUS) ×2 IMPLANT
CRADLE DONUT ADULT HEAD (MISCELLANEOUS) ×2 IMPLANT
DECANTER SPIKE VIAL GLASS SM (MISCELLANEOUS) ×2 IMPLANT
DRAPE C-ARM 42X72 X-RAY (DRAPES) ×2 IMPLANT
DRAPE INCISE IOBAN 66X45 STRL (DRAPES) ×2 IMPLANT
DRAPE POUCH INSTRU U-SHP 10X18 (DRAPES) ×2 IMPLANT
DRAPE SURG 17X23 STRL (DRAPES) ×2 IMPLANT
DRAPE U-SHAPE 47X51 STRL (DRAPES) ×2 IMPLANT
DRSG OPSITE POSTOP 3X4 (GAUZE/BANDAGES/DRESSINGS) ×2 IMPLANT
DURAPREP 26ML APPLICATOR (WOUND CARE) ×2 IMPLANT
ELECT COATED BLADE 2.86 ST (ELECTRODE) ×2 IMPLANT
ELECT PENCIL ROCKER SW 15FT (MISCELLANEOUS) ×2 IMPLANT
ELECT REM PT RETURN 9FT ADLT (ELECTROSURGICAL) ×2
ELECTRODE REM PT RTRN 9FT ADLT (ELECTROSURGICAL) ×1 IMPLANT
FLOSEAL 5ML (HEMOSTASIS) ×2 IMPLANT
GLOVE BIO SURGEON STRL SZ 6.5 (GLOVE) ×2 IMPLANT
GLOVE BIOGEL PI IND STRL 6.5 (GLOVE) ×1 IMPLANT
GLOVE BIOGEL PI IND STRL 8.5 (GLOVE) ×1 IMPLANT
GLOVE BIOGEL PI INDICATOR 6.5 (GLOVE) ×1
GLOVE BIOGEL PI INDICATOR 8.5 (GLOVE) ×1
GLOVE SS BIOGEL STRL SZ 8.5 (GLOVE) ×3 IMPLANT
GLOVE SUPERSENSE BIOGEL SZ 8.5 (GLOVE) ×3
GOWN STRL REUS W/ TWL LRG LVL3 (GOWN DISPOSABLE) ×1 IMPLANT
GOWN STRL REUS W/TWL 2XL LVL3 (GOWN DISPOSABLE) ×2 IMPLANT
GOWN STRL REUS W/TWL LRG LVL3 (GOWN DISPOSABLE) ×1
KIT BASIN OR (CUSTOM PROCEDURE TRAY) ×2 IMPLANT
KIT TURNOVER KIT B (KITS) ×2 IMPLANT
NEEDLE HYPO 22GX1.5 SAFETY (NEEDLE) ×2 IMPLANT
NEEDLE SPNL 18GX3.5 QUINCKE PK (NEEDLE) ×2 IMPLANT
NS IRRIG 1000ML POUR BTL (IV SOLUTION) ×4 IMPLANT
PACK ORTHO CERVICAL (CUSTOM PROCEDURE TRAY) ×2 IMPLANT
PACK UNIVERSAL I (CUSTOM PROCEDURE TRAY) ×2 IMPLANT
PAD ARMBOARD 7.5X6 YLW CONV (MISCELLANEOUS) ×4 IMPLANT
PATTIES SURGICAL .25X.25 (GAUZE/BANDAGES/DRESSINGS) IMPLANT
PIN DISTRACTION 14 (PIN) ×4 IMPLANT
PLATE SKYLINE 12MM (Plate) ×2 IMPLANT
RESTRAINT LIMB HOLDER UNIV (RESTRAINTS) ×2 IMPLANT
SCREW SKYLINE 14MM SD-VA (Screw) ×8 IMPLANT
SPONGE INTESTINAL PEANUT (DISPOSABLE) ×4 IMPLANT
SPONGE SURGIFOAM ABS GEL SZ50 (HEMOSTASIS) ×2 IMPLANT
STAPLER VISISTAT (STAPLE) ×2 IMPLANT
SUT BONE WAX W31G (SUTURE) ×2 IMPLANT
SUT MON AB 3-0 SH 27 (SUTURE) ×1
SUT MON AB 3-0 SH27 (SUTURE) ×1 IMPLANT
SUT SILK 2 0 TIES 10X30 (SUTURE) ×2 IMPLANT
SUT VIC AB 2-0 CT1 18 (SUTURE) ×2 IMPLANT
SYR BULB IRRIGATION 50ML (SYRINGE) ×2 IMPLANT
SYR CONTROL 10ML LL (SYRINGE) ×2 IMPLANT
TAPE CLOTH 4X10 WHT NS (GAUZE/BANDAGES/DRESSINGS) ×2 IMPLANT
TAPE UMBILICAL COTTON 1/8X30 (MISCELLANEOUS) ×2 IMPLANT
TOWEL GREEN STERILE (TOWEL DISPOSABLE) ×2 IMPLANT
TOWEL GREEN STERILE FF (TOWEL DISPOSABLE) ×2 IMPLANT
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
## 2018-05-01 NOTE — Transfer of Care (Signed)
Immediate Anesthesia Transfer of Care Note  Patient: Shaun PerchWilliam Scullion  Procedure(s) Performed: ANTERIOR CERVICAL DECOMPRESSION/DISCECTOMY FUSION CERVICAL 4-5 (N/A Neck)  Patient Location: PACU  Anesthesia Type:General  Level of Consciousness: awake, alert  and oriented  Airway & Oxygen Therapy: Patient Spontanous Breathing and Patient connected to nasal cannula oxygen  Post-op Assessment: Report given to RN and Post -op Vital signs reviewed and stable  Post vital signs: Reviewed and stable  Last Vitals:  Vitals Value Taken Time  BP 118/73 05/01/2018  4:01 PM  Temp    Pulse 102 05/01/2018  4:12 PM  Resp 19 05/01/2018  4:12 PM  SpO2 93 % 05/01/2018  4:12 PM  Vitals shown include unvalidated device data.  Last Pain:  Vitals:   05/01/18 1600  TempSrc:   PainSc: 0-No pain      Patients Stated Pain Goal: 3 (05/01/18 1207)  Complications: No apparent anesthesia complications

## 2018-05-01 NOTE — Op Note (Signed)
Operative report  Preoperative diagnosis: C4-5 lateral mass fracture with C5 right-sided radiculopathy  Postoperative diagnosis: Same  Operative procedure: Anterior cervical discectomy and fusion (ACDF) C4-5  First assistant: Anette Riedelarmen Mayo, PA  Implant system: Titan nano lock intervertebral spacer: 8 mm small lordotic packed with allograft: vivogen.  Depey anterior cervical plate (skyline)  Complications: None  Indications: Shaun Wells is a very pleasant otherwise healthy [age] year old gentleman who presented to my care last week with significant neck pain and radicular right arm pain.  He had weakness in the right C5 dermatome (deltoid and bicep).  Imaging studies confirmed a C4-5 lateral mass fracture on the right side.  There is hematoma in the foramen and bone fragment causing further stenosis in the foramen.  Collectively this is leading to significant C5 compression.  As result of the motor and sensory deficits we elected to proceed with surgery to stabilize his C4-5 level.  All appropriate risks benefits and alternatives to surgery were discussed consent was obtained.  Operative note  Patient was brought the operating room placed upon the operating room table.  After successful induction of general anesthesia and endotracheally patient teds SCDs were applied and the anterior cervical spine was prepped and draped in a standard fashion.  Timeout was taken to confirm patient procedure and all other important data.  X-ray was used to identify the C5 disc space level and I marked this on the skin.  Marcaine epinephrine.  Transverse incision was made and sharp dissection was carried out down to the platysma.  But this was sharply incised and I sharply dissected along the medial border the sternocleidomastoid until I was through the deep cervical fascia.  I was able to palpate the carotid sheath and protected with the finger laterally.  I was able to mobilize the esophagus and trachea to the right-hand  side so I can complete dissecting through the prevertebral fascia.  Self-retaining retractor was applied and I completed my prevertebral blunt dissection until I exposed the entire C4-5 disc space level.  The needle was placed into the disc space and I took an x-ray to confirm that I was at the appropriate level.  Using bipolar cautery and then mobilized the longus coli muscles from the mid body of C4 to the mid body of C5 bilaterally.  Self-retaining retractor was placed underneath the longus coli muscle the endotracheal cuff was deflated and explained the retracted to the appropriate width.  Annulotomy was performed with a 15 blade scalpel and I used a combination of pituitary Rogers Kerrison rongeurs to remove the bulk of the disc material.  The overhanging osteophyte from the inferior aspect of C4 was resected with a 2 mm Kerrison punch.  Distraction pins were then placed into the bodies of C4 and C5 and I gently distracted the intervertebral space and maintain that with the distraction pin set.  I continued using neural curettes working posteriorly removing the remainder of disc material.  The annulus was then taken down and then I used by 1 mm Kerrison to undercut the uncovertebral joint on the right side.  At this point I was able to freely sweep my nerve hook in the foramen.  I removed all the disc material and had adequately decompress the space.  At this point I then measured and placed the 8 mm lordotic Titan cage.  This was packed with allograft.  After was malleted into position he was noted to be stable and there is no complicating features.  This distraction pins were removed  holes were plugged with bone wax.  The wound was again copiously irrigated with normal saline and the 12 mm anterior plate was applied and secured with 14 mm length screws.  All 4 screws had excellent purchase.  At this point there was self-retaining retractor was removed and irrigated the wound copiously with normal saline and  then used bipolar cautery and FloSeal to make sure I had hemostasis.  After final irrigation the trachea and esophagus were returned to midline and I closed the platysma with interrupted 2-0 Vicryl suture.  Skin was then closed with 3-0 Monocryl.  Steri-Strips dry dressing were applied and the patient was ultimately extubated transfer the PACU without incident.  The Aspen collar was reapplied prior to transfer  The end of the case all needle sponge counts were correct.  There are no complications.

## 2018-05-01 NOTE — Progress Notes (Addendum)
Vancomycin already started, pt. Called nurse.Family stated pt. Began showing facial flushing. Rate of Vancomycin slowed to 100cc/hr. Denies itching, sob, chest or rash on any other part of body. Notified Dr. Ricarda FrameMiller,stated to get blood pressure. Pressure 147/104, near baseline. OR nurses in to take pt. To surgery. Notified  Dr. Hyacinth MeekerMiller.

## 2018-05-01 NOTE — Anesthesia Preprocedure Evaluation (Addendum)
Anesthesia Evaluation  Patient identified by MRN, date of birth, ID band Patient awake    Reviewed: Allergy & Precautions, NPO status , Patient's Chart, lab work & pertinent test results  Airway Mallampati: II  TM Distance: >3 FB Neck ROM: Full    Dental no notable dental hx.    Pulmonary neg pulmonary ROS,    Pulmonary exam normal breath sounds clear to auscultation       Cardiovascular negative cardio ROS Normal cardiovascular exam Rhythm:Regular Rate:Normal     Neuro/Psych  Headaches, negative neurological ROS  negative psych ROS   GI/Hepatic negative GI ROS, Neg liver ROS,   Endo/Other  negative endocrine ROS  Renal/GU negative Renal ROS  negative genitourinary   Musculoskeletal negative musculoskeletal ROS (+)   Abdominal   Peds negative pediatric ROS (+)  Hematology negative hematology ROS (+)   Anesthesia Other Findings   Reproductive/Obstetrics negative OB ROS                             Anesthesia Physical Anesthesia Plan  ASA: II  Anesthesia Plan: General   Post-op Pain Management:    Induction: Intravenous  PONV Risk Score and Plan: 2 and Ondansetron and Midazolam  Airway Management Planned: Oral ETT and Video Laryngoscope Planned  Additional Equipment:   Intra-op Plan:   Post-operative Plan: Extubation in OR  Informed Consent: I have reviewed the patients History and Physical, chart, labs and discussed the procedure including the risks, benefits and alternatives for the proposed anesthesia with the patient or authorized representative who has indicated his/her understanding and acceptance.   Dental advisory given  Plan Discussed with: CRNA  Anesthesia Plan Comments:        Anesthesia Quick Evaluation

## 2018-05-01 NOTE — Discharge Instructions (Signed)
Cervical Fusion, Care After °This sheet gives you information about how to care for yourself after your procedure. Your health care provider may also give you more specific instructions. If you have problems or questions, contact your health care provider. °What can I expect after the procedure? °After the procedure, it is common to have: °· Incision area pain. °· Numbness. °· Weakness. °· Sore throat. °· Difficulty swallowing. ° °Follow these instructions at home: °Medicines °· Take over-the-counter and prescription medicines, including pain medicines, only as told by your health care provider. °· If you were prescribed an antibiotic medicine, take it as told by your health care provider. Do not stop taking the antibiotic even if you start to feel better. °If you have a brace: °· Wear the brace as told by your health care provider. Remove it only as told by your health care provider. °· Keep the brace clean. °Incision care °· Follow instructions from your health care provider about how to take care of your incision. Make sure you: °? Wash your hands with soap and water before you change your bandage (dressing). If soap and water are not available, use hand sanitizer. °? Change your dressing as told by your health care provider. °? Leave stitches (sutures), skin glue, or adhesive strips in place. These skin closures may need to be in place for 2 weeks or longer. If adhesive strip edges start to loosen and curl up, you may trim the loose edges. Do not remove adhesive strips completely unless your health care provider tells you to do that. °· Keep your incision clean and dry. Do not take baths, swim, or use a hot tub until your health care provider approves. °· Check your incision area every day for signs of infection. Check for: °? More redness, swelling, or pain. °? More fluid or blood. °? Warmth. °? Pus or a bad smell. °Activity ° °· Rest and protect your back as much as possible. °· Do not lift anything that is  heavier than 10 lb (4.5 kg) or the limit that you are told by your health care provider. °· Do not twist or bend at the waist until your health care provider approves. °· Avoid: °? Pushing and pulling motions. °? Lifting anything over your head. °? Sitting or lying down in the same position for long periods of time. °· Do not exercise until your health care provider approves. Once your health care provider has approved exercise, ask him or her what kinds of exercises you can do to make your back stronger (physical therapy). °· Do not drive until your health care provider approves. °? Do not drive for 24 hours if you received a medicine to help you relax (sedative). °? Do not drive or use heavy machinery while taking prescription pain medicine. °General instructions ° °· Have someone assist you to turn in bed frequently by moving your whole body without twisting your back (log rolling technique). °· Wear compression stockings as told by your health care provider. These stockings help to prevent blood clots and reduce swelling in your legs. °· Do not use any products that contain nicotine or tobacco, such as cigarettes and e-cigarettes. These can delay bone healing. If you need help quitting, ask your health care provider. °· To prevent or treat constipation while you are taking prescription pain medicine, your health care provider may recommend that you: °? Drink enough fluid to keep your urine clear or pale yellow. °? Take over-the-counter or prescription medicines. °? Eat foods   that are high in fiber, such as fresh fruits and vegetables, whole grains, and beans. °? Limit foods that are high in fat and processed sugars, such as fried and sweet foods. °· Keep all follow-up visits as told by your health care provider. This is important. °Contact a health care provider if: °· You have pain that gets worse or does not get better with medicine. °· You have more redness, swelling, or pain around your incision. °· You have  more fluid or blood coming from your incision. °· Your incision feels warm to the touch. °· You have pus or a bad smell coming from your incision. °· You have a fever. °· You have weakness or numbness in your legs that is new or getting worse. °· You have swelling in your calf or leg. °· The edges of your incision break open. °· You vomit or feel nauseous. °· You have trouble controlling urination or bowel movements. °Get help right away if: °· You develop shortness of breath or chest pain. °· You have trouble swallowing. °· You have trouble breathing. °· You develop a cough. °This information is not intended to replace advice given to you by your health care provider. Make sure you discuss any questions you have with your health care provider. °Document Released: 05/16/2004 Document Revised: 04/26/2016 Document Reviewed: 04/12/2016 °Elsevier Interactive Patient Education © 2018 [employer] ° ° ° ° ° ° °

## 2018-05-01 NOTE — Anesthesia Procedure Notes (Addendum)
Procedure Name: Intubation Date/Time: 05/01/2018 1:36 PM Performed by: Larina BrasSmith, Emilee, RN Pre-anesthesia Checklist: Patient identified, Emergency Drugs available, Suction available and Patient being monitored Patient Re-evaluated:Patient Re-evaluated prior to induction Oxygen Delivery Method: Circle System Utilized Preoxygenation: Pre-oxygenation with 100% oxygen Induction Type: IV induction Ventilation: Mask ventilation without difficulty Laryngoscope Size: Glidescope and 4 Grade View: Grade I Tube type: Oral Tube size: 7.5 mm Number of attempts: 1 Airway Equipment and Method: Stylet and Oral airway Placement Confirmation: ETT inserted through vocal cords under direct vision,  positive ETCO2 and breath sounds checked- equal and bilateral Secured at: 23 cm Tube secured with: Tape Dental Injury: Teeth and Oropharynx as per pre-operative assessment  Difficulty Due To: Difficult Airway- due to cervical collar and Difficult Airway-  due to neck instability Comments: Hard cervical collar remains in place for laryngoscopy, head and neck remain neutral

## 2018-05-02 ENCOUNTER — Encounter (HOSPITAL_COMMUNITY): Payer: Self-pay | Admitting: Orthopedic Surgery

## 2018-05-02 DIAGNOSIS — M2578 Osteophyte, vertebrae: Secondary | ICD-10-CM | POA: Diagnosis not present

## 2018-05-02 DIAGNOSIS — S12400A Unspecified displaced fracture of fifth cervical vertebra, initial encounter for closed fracture: Secondary | ICD-10-CM | POA: Diagnosis not present

## 2018-05-02 DIAGNOSIS — S12300A Unspecified displaced fracture of fourth cervical vertebra, initial encounter for closed fracture: Secondary | ICD-10-CM | POA: Diagnosis not present

## 2018-05-02 DIAGNOSIS — M5412 Radiculopathy, cervical region: Secondary | ICD-10-CM | POA: Diagnosis not present

## 2018-05-02 MED FILL — Thrombin (Recombinant) For Soln 20000 Unit: CUTANEOUS | Qty: 1 | Status: AC

## 2018-05-02 NOTE — Evaluation (Addendum)
Occupational Therapy Evaluation Patient Details Name: Shaun Wells MRN: [id number] DOB: Dec 07, 1980 Today's Date: 05/02/2018    History of Present Illness Pt is a 37 y/o male who presents with C4-C5 facet fracture with R C5 nerve deficit. He is now s/p C4-C5 ACDF on 05/01/18.   Clinical Impression   [name] presents with the above. At baseline pt is independent with ADLs and functional mobility. Pt demonstrating functional mobility without AD and supervision; Currently requires min-modA for LB ADLs secondary to adhering to cervical precautions. Pt reports significant other will be available after discharge home to assist with ADL completion PRN. Reviewed cervical precautions with both pt/pt's family including compensatory strategies for completing UB/LB ADLs while maintaining precautions with pt and pt's family verbalizing understanding. Questions answered throughout. Feel pt is safe to return home from OT standpoint once medically ready. No further acute OT needs identified at this time. Will sign off.     Follow Up Recommendations  No OT follow up;Supervision - Intermittent    Equipment Recommendations  None recommended by OT           Precautions / Restrictions Precautions Precautions: Cervical Precaution Booklet Issued: Yes (comment) Precaution Comments: issued and reviewed with pt/pt family  Required Braces or Orthoses: Cervical Brace Cervical Brace: Hard collar;At all times Restrictions Weight Bearing Restrictions: No      Mobility Bed Mobility               General bed mobility comments: sitting EOB upon arrival   Transfers Overall transfer level: Modified independent                    Balance Overall balance assessment: No apparent balance deficits (not formally assessed)                                         ADL either performed or assessed with clinical judgement   ADL Overall ADL's : Needs  assistance/impaired Eating/Feeding: Independent;Sitting   Grooming: Standing;Supervision/safety   Upper Body Bathing: Sitting;Supervision/ safety   Lower Body Bathing: Minimal assistance;Sit to/from stand   Upper Body Dressing : Min guard;Sitting   Lower Body Dressing: Moderate assistance;Sit to/from stand   Toilet Transfer: Supervision/safety;Ambulation;Regular Toilet   Toileting- Architect and Hygiene: Supervision/safety;Sit to/from Nurse, children's Details (indicate cue type and reason): verbally reviewed safe transfer technique for tub transfer with pt/pt's significant other verbalizing understanding  Functional mobility during ADLs: Supervision/safety General ADL Comments: reviewed cervical precautions with both pt/pt's family including compensatory strategies for completing UB/LB ADLs while maintaining precautions; pt dressed at start of session, having completed task with assist from significant other earlier this AM. Reviewed brace wear and management                         Pertinent Vitals/Pain Pain Assessment: Faces Faces Pain Scale: No hurt Pain Intervention(s): Monitored during session;Limited activity within patient's tolerance     Hand Dominance Right   Extremity/Trunk Assessment Upper Extremity Assessment Upper Extremity Assessment: Overall WFL for tasks assessed;RUE deficits/detail RUE Deficits / Details: pt reports weakness in RUE prior to surgery, but has since subsided, demosntrates good grip strength and FMC    Lower Extremity Assessment Lower Extremity Assessment: Defer to PT evaluation   Cervical / Trunk Assessment Cervical / Trunk Assessment: Other exceptions Cervical / Trunk  Exceptions: s/p cervical surgery    Communication Communication Communication: No difficulties   Cognition Arousal/Alertness: Awake/alert Behavior During Therapy: WFL for tasks assessed/performed Overall Cognitive Status: Within Functional  Limits for tasks assessed                                     General Comments       Exercises Other Exercises Other Exercises: reviewed fine motor/coordination activities and exercises to complete at home    Shoulder Instructions      Home Living Family/patient expects to be discharged to:: Private residence Living Arrangements: Spouse/significant other Available Help at Discharge: Family;Available 24 hours/day Type of Home: House Home Access: Stairs to enter           Bathroom Shower/Tub: Chief Strategy OfficerTub/shower unit   Bathroom Toilet: Standard     Home Equipment: None          Prior Functioning/Environment Level of Independence: Independent                 OT Problem List: Decreased range of motion;Decreased knowledge of precautions      OT Treatment/Interventions:      OT Goals(Current goals can be found in the care plan section) Acute Rehab OT Goals Patient Stated Goal: home today  OT Goal Formulation: All assessment and education complete, DC therapy  OT Frequency:     Barriers to D/C:            Co-evaluation              AM-PAC PT "6 Clicks" Daily Activity     Outcome Measure Help from another person eating meals?: None Help from another person taking care of personal grooming?: None Help from another person toileting, which includes using toliet, bedpan, or urinal?: None Help from another person bathing (including washing, rinsing, drying)?: A Little Help from another person to put on and taking off regular upper body clothing?: None Help from another person to put on and taking off regular lower body clothing?: A Little 6 Click Score: 22   End of Session Equipment Utilized During Treatment: Cervical collar Nurse Communication: Mobility status  Activity Tolerance: Patient tolerated treatment well Patient left: with call bell/phone within reach;with family/visitor present;Other (comment)(sitting EOB )  OT Visit Diagnosis:  Other abnormalities of gait and mobility (R26.89)                Time: 2956-21300810-0822 OT Time Calculation (min): 12 min Charges:  OT General Charges $OT Visit: 1 Visit OT Evaluation $OT Eval Low Complexity: 1 Low G-Codes:     Shaun Wells, OT Pager [phone] 05/02/2018   Orlando PennerBreanna L Ferd Horrigan 05/02/2018, 10:03 AM

## 2018-05-02 NOTE — Progress Notes (Signed)
    Subjective: 1 Day Post-Op Procedure(s) (LRB): ANTERIOR CERVICAL DECOMPRESSION/DISCECTOMY FUSION CERVICAL 4-5 (N/A) Patient reports pain as 0 on 0-10 scale.   Denies CP or SOB.  Voiding without difficulty. Positive flatus. Denies arm pain Objective: Vital signs in last 24 hours: Temp:  [97.3 F (36.3 C)-98.7 F (37.1 C)] 98 F (36.7 C) (07/18 0349) Pulse Rate:  [92-117] 92 (07/18 0349) Resp:  [11-21] 18 (07/18 0349) BP: (117-147)/(67-104) 117/69 (07/18 0349) SpO2:  [92 %-100 %] 96 % (07/18 0349) Weight:  [74.8 kg (165 lb)] 74.8 kg (165 lb) (07/17 1155)  Intake/Output from previous day: 07/17 0701 - 07/18 0700 In: 1740 [P.O.:240; I.V.:1300; IV Piggyback:200] Out: 20 [Blood:20] Intake/Output this shift: No intake/output data recorded.  Labs: Recent Labs    05/01/18 1213  HGB 16.7   Recent Labs    05/01/18 1213  WBC 4.5  RBC 5.54  HCT 49.4  PLT 186   No results for input(s): NA, K, CL, CO2, BUN, CREATININE, GLUCOSE, CALCIUM in the last 72 hours. No results for input(s): LABPT, INR in the last 72 hours.  Physical Exam: Neurologically intact ABD soft Sensation intact distally Dorsiflexion/Plantar flexion intact Incision: no drainage Compartment soft Body mass index is 23.68 kg/m. Aspen collar in place  Assessment/Plan: 1 Day Post-Op Procedure(s) (LRB): ANTERIOR CERVICAL DECOMPRESSION/DISCECTOMY FUSION CERVICAL 4-5 (N/A) Advance diet Up with therapy  Pt may Dc home after cleared by PT  Deshayla Empson, Baxter Kailarmen Christina for Dr. Venita Lickahari Brooks [facility] [phone] 05/02/2018, 7:41 AM

## 2018-05-02 NOTE — Progress Notes (Signed)
Patient alert and oriented, mae's well, voiding adequate amount of urine, swallowing without difficulty, no c/o pain at time of discharge. Patient discharged home with family. Script and discharged instructions given to patient. Patient and family stated understanding of instructions given. Patient has an appointment with Dr. Brooks  

## 2018-05-02 NOTE — Anesthesia Postprocedure Evaluation (Signed)
Anesthesia Post Note  Patient: Shaun Wells  Procedure(s) Performed: ANTERIOR CERVICAL DECOMPRESSION/DISCECTOMY FUSION CERVICAL 4-5 (N/A Neck)     Patient location during evaluation: PACU Anesthesia Type: General Level of consciousness: awake and alert Pain management: pain level controlled Vital Signs Assessment: post-procedure vital signs reviewed and stable Respiratory status: spontaneous breathing, nonlabored ventilation and respiratory function stable Cardiovascular status: blood pressure returned to baseline and stable Postop Assessment: no apparent nausea or vomiting Anesthetic complications: no    Last Vitals:  Vitals:   05/01/18 2337 05/02/18 0349  BP: 123/73 117/69  Pulse: 99 92  Resp: 18 18  Temp: 36.6 C 36.7 C  SpO2: 97% 96%    Last Pain:  Vitals:   05/02/18 0349  TempSrc: Oral  PainSc:                  Shaun Wells [place]

## 2018-05-02 NOTE — Evaluation (Signed)
Physical Therapy Evaluation and Discharge Patient Details Name: Shaun Wells MRN: [id number] DOB: 08-25-1981 Today's Date: 05/02/2018   History of Present Illness  Pt is a 37 y/o male who presents with C4-C5 facet fracture with R C5 nerve deficit. He is now s/p C4-C5 ACDF on 05/01/18.  Clinical Impression  Patient evaluated by Physical Therapy with no further acute PT needs identified. All education has been completed and the patient has no further questions. At the time of Pt eval pt was able to perform transfers and ambulation with gross modified independence to independence and no AD. Pt was educated on precautions, car transfer, stair negotiation, brace adjustment, and general safety with activity progression. See below for any follow-up Physical Therapy or equipment needs. PT is signing off. Thank you for this referral.     Follow Up Recommendations No PT follow up;Supervision - Intermittent    Equipment Recommendations  None recommended by PT    Recommendations for Other Services       Precautions / Restrictions Precautions Precautions: Cervical Precaution Booklet Issued: Yes (comment) Precaution Comments: issued and reviewed with pt/pt family  Required Braces or Orthoses: Cervical Brace Cervical Brace: Hard collar;At all times Restrictions Weight Bearing Restrictions: No      Mobility  Bed Mobility Overal bed mobility: Modified Independent             General bed mobility comments: sitting EOB upon arrival   Transfers Overall transfer level: Modified independent Equipment used: None                Ambulation/Gait Ambulation/Gait assistance: Modified independent (Device/Increase time) Gait Distance (Feet): 300 Feet Assistive device: None Gait Pattern/deviations: WFL(Within Functional Limits) Gait velocity: Decreased Gait velocity interpretation: >2.62 ft/sec, indicative of community ambulatory General Gait Details: Slightly decreased gait speed  but overall steady without gait deviation or LOB.   Stairs Stairs: Yes Stairs assistance: Modified independent (Device/Increase time) Stair Management: One rail Left;Alternating pattern;Forwards Number of Stairs: 10 General stair comments: No difficulty completing  Wheelchair Mobility    Modified Rankin (Stroke Patients Only)       Balance Overall balance assessment: No apparent balance deficits (not formally assessed)                                           Pertinent Vitals/Pain Pain Assessment: Faces Faces Pain Scale: No hurt Pain Location: Incision site Pain Descriptors / Indicators: Operative site guarding Pain Intervention(s): Monitored during session;Limited activity within patient's tolerance    Home Living Family/patient expects to be discharged to:: Private residence Living Arrangements: Spouse/significant other Available Help at Discharge: Family;Available 24 hours/day Type of Home: House Home Access: Stairs to enter Entrance Stairs-Rails: Left Entrance Stairs-Number of Steps: 4-6 Home Layout: One level Home Equipment: None      Prior Function Level of Independence: Independent               Hand Dominance   Dominant Hand: Right    Extremity/Trunk Assessment   Upper Extremity Assessment Upper Extremity Assessment: Overall WFL for tasks assessed;RUE deficits/detail RUE Deficits / Details: pt reports weakness in RUE prior to surgery, but has since subsided, demosntrates good grip strength and FMC     Lower Extremity Assessment Lower Extremity Assessment: Defer to PT evaluation    Cervical / Trunk Assessment Cervical / Trunk Assessment: Other exceptions Cervical / Trunk Exceptions: s/p cervical surgery  Communication   Communication: No difficulties  Cognition Arousal/Alertness: Awake/alert Behavior During Therapy: WFL for tasks assessed/performed Overall Cognitive Status: Within Functional Limits for tasks assessed                                         General Comments      Exercises Other Exercises Other Exercises: reviewed fine motor/coordination activities and exercises to complete at home    Assessment/Plan    PT Assessment Patent does not need any further PT services  PT Problem List         PT Treatment Interventions      PT Goals (Current goals can be found in the Care Plan section)  Acute Rehab PT Goals Patient Stated Goal: home today  PT Goal Formulation: All assessment and education complete, DC therapy    Frequency     Barriers to discharge        Co-evaluation               AM-PAC PT "6 Clicks" Daily Activity  Outcome Measure Difficulty turning over in bed (including adjusting bedclothes, sheets and blankets)?: None Difficulty moving from lying on back to sitting on the side of the bed? : None Difficulty sitting down on and standing up from a chair with arms (e.g., wheelchair, bedside commode, etc,.)?: None Help needed moving to and from a bed to chair (including a wheelchair)?: None Help needed walking in hospital room?: None Help needed climbing 3-5 steps with a railing? : None 6 Click Score: 24    End of Session Equipment Utilized During Treatment: Cervical collar Activity Tolerance: Patient tolerated treatment well Patient left: with family/visitor present(Sitting EOB with OT present) Nurse Communication: Mobility status PT Visit Diagnosis: Pain;Other symptoms and signs involving the nervous system (R29.898)    Time: 1610-9604: 0755-0811 PT Time Calculation (min) (ACUTE ONLY): 16 min   Charges:   PT Evaluation $PT Eval Low Complexity: 1 Low     PT G Codes:        Shaun Wells, PT, DPT Acute Rehabilitation Services Pager: [phone]   Shaun Wells 05/02/2018, 12:52 PM
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
## 2018-06-11 DIAGNOSIS — S129XXD Fracture of neck, unspecified, subsequent encounter: Secondary | ICD-10-CM | POA: Diagnosis not present

## 2018-06-24 DIAGNOSIS — M542 Cervicalgia: Secondary | ICD-10-CM | POA: Diagnosis not present

## 2018-06-27 DIAGNOSIS — M542 Cervicalgia: Secondary | ICD-10-CM | POA: Diagnosis not present

## 2018-07-02 DIAGNOSIS — M542 Cervicalgia: Secondary | ICD-10-CM | POA: Diagnosis not present

## 2018-07-05 DIAGNOSIS — M542 Cervicalgia: Secondary | ICD-10-CM | POA: Diagnosis not present

## 2018-07-09 DIAGNOSIS — M542 Cervicalgia: Secondary | ICD-10-CM | POA: Diagnosis not present

## 2018-07-15 DIAGNOSIS — M542 Cervicalgia: Secondary | ICD-10-CM | POA: Diagnosis not present

## 2018-07-16 ENCOUNTER — Encounter: Payer: Self-pay | Admitting: Family Medicine

## 2018-07-16 ENCOUNTER — Ambulatory Visit (INDEPENDENT_AMBULATORY_CARE_PROVIDER_SITE_OTHER): Payer: BLUE CROSS/BLUE SHIELD | Admitting: Family Medicine

## 2018-07-16 VITALS — BP 99/60 | HR 63 | Temp 97.9°F | Wt 170.8 lb

## 2018-07-16 DIAGNOSIS — Z0000 Encounter for general adult medical examination without abnormal findings: Secondary | ICD-10-CM

## 2018-07-16 DIAGNOSIS — M7989 Other specified soft tissue disorders: Secondary | ICD-10-CM | POA: Diagnosis not present

## 2018-07-16 DIAGNOSIS — Z981 Arthrodesis status: Secondary | ICD-10-CM | POA: Diagnosis not present

## 2018-07-16 NOTE — Patient Instructions (Signed)
Thank you for coming in to see [place] today. Please see below to review our plan for today's visit.  1.  I am pleased to hear that you stop using chewing tobacco.  It is important that you stay off this. 2.  Please reconsider getting the flu shot.  This is a preventable illness and you are not immune. 3.  If you miss any changes to your bump on your right thigh, let me know immediately.  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  Please call the clinic at [phone] if your symptoms worsen or you have any concerns. It was our pleasure to serve you.  Durward Parcel, DO [facility], PGY-3

## 2018-07-16 NOTE — Assessment & Plan Note (Signed)
Chronic.  Status post L4-L5 cervical laminectomy and decompression with discectomy by Dr. Shon Baton.  Appears to be stable off medications. - Follow-up with neurosurgery

## 2018-07-16 NOTE — Assessment & Plan Note (Signed)
Chronic.  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  Unlikely hernia given location.  Possibly vascular in nature though less likely given lack of thrill or bruit.  Characteristics consistent with benign etiology. - Advised patient to continue monitoring and notify me immediately if he has any changes
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
## 2018-07-22 DIAGNOSIS — M542 Cervicalgia: Secondary | ICD-10-CM | POA: Diagnosis not present

## 2018-07-23 DIAGNOSIS — Z981 Arthrodesis status: Secondary | ICD-10-CM | POA: Diagnosis not present

## 2018-07-25 DIAGNOSIS — M542 Cervicalgia: Secondary | ICD-10-CM | POA: Diagnosis not present

## 2018-08-06 DIAGNOSIS — M542 Cervicalgia: Secondary | ICD-10-CM | POA: Diagnosis not present

## 2018-08-08 DIAGNOSIS — M25511 Pain in right shoulder: Secondary | ICD-10-CM | POA: Diagnosis not present

## 2018-08-13 DIAGNOSIS — M542 Cervicalgia: Secondary | ICD-10-CM | POA: Diagnosis not present

## 2018-08-20 ENCOUNTER — Telehealth: Payer: Self-pay

## 2018-08-20 NOTE — Telephone Encounter (Signed)
Patient would like to know if he can get a prescription for Flonase for his allergies and if an OV is required to get this.  Call Tonya back at [phone]  Ples Specter, RN [facility] RN)

## 2018-08-21 ENCOUNTER — Other Ambulatory Visit: Payer: Self-pay | Admitting: Family Medicine

## 2018-08-21 MED ORDER — FLUTICASONE PROPIONATE 50 MCG/ACT NA SUSP: 2.0000 | Freq: Every day | NASAL | 6 refills | Status: DC

## 2018-08-21 NOTE — Telephone Encounter (Signed)
Attempted to call patient to inform him of the RX for Flonase. No answer and voicemail is full.  If patient happens to call back, please let him know.   Shaun Wells, CMA

## 2018-08-21 NOTE — Telephone Encounter (Signed)
I am okay with filling this request.  Prescription for Flonase sent.  Please advise.  Durward Parcel, DO [facility], PGY-3

## 2018-08-21 NOTE — Telephone Encounter (Signed)
Called patient and informed him that his RX for Flonase was filled.  Patient states that pharmacy has sent him a text.  Glennie Hawk, CMA

## 2018-10-17 DIAGNOSIS — M542 Cervicalgia: Secondary | ICD-10-CM | POA: Diagnosis not present

## 2019-04-10 DIAGNOSIS — I7300 Raynaud's syndrome without gangrene: Secondary | ICD-10-CM | POA: Diagnosis not present

## 2019-04-10 DIAGNOSIS — Z79899 Other long term (current) drug therapy: Secondary | ICD-10-CM | POA: Diagnosis not present

## 2019-04-15 DIAGNOSIS — M79671 Pain in right foot: Secondary | ICD-10-CM | POA: Diagnosis not present

## 2019-04-15 DIAGNOSIS — R200 Anesthesia of skin: Secondary | ICD-10-CM | POA: Diagnosis not present

## 2019-04-15 DIAGNOSIS — M7741 Metatarsalgia, right foot: Secondary | ICD-10-CM | POA: Diagnosis not present

## 2019-04-15 DIAGNOSIS — M7742 Metatarsalgia, left foot: Secondary | ICD-10-CM | POA: Diagnosis not present

## 2019-05-05 DIAGNOSIS — Z981 Arthrodesis status: Secondary | ICD-10-CM | POA: Diagnosis not present
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
## 2019-06-05 IMAGING — RF DG C-ARM 61-120 MIN
1 series · 2 of 2 positions shown · non-contrast
Comparison: CT of the cervical spine 04/26/2018.

CLINICAL DATA: C4-5 ACDF.

EXAM:
DG C-ARM 61-120 MIN; CERVICAL SPINE - 2-3 VIEW

[Series 1: run · 2 of 2 slices shown]
[im 1/2]
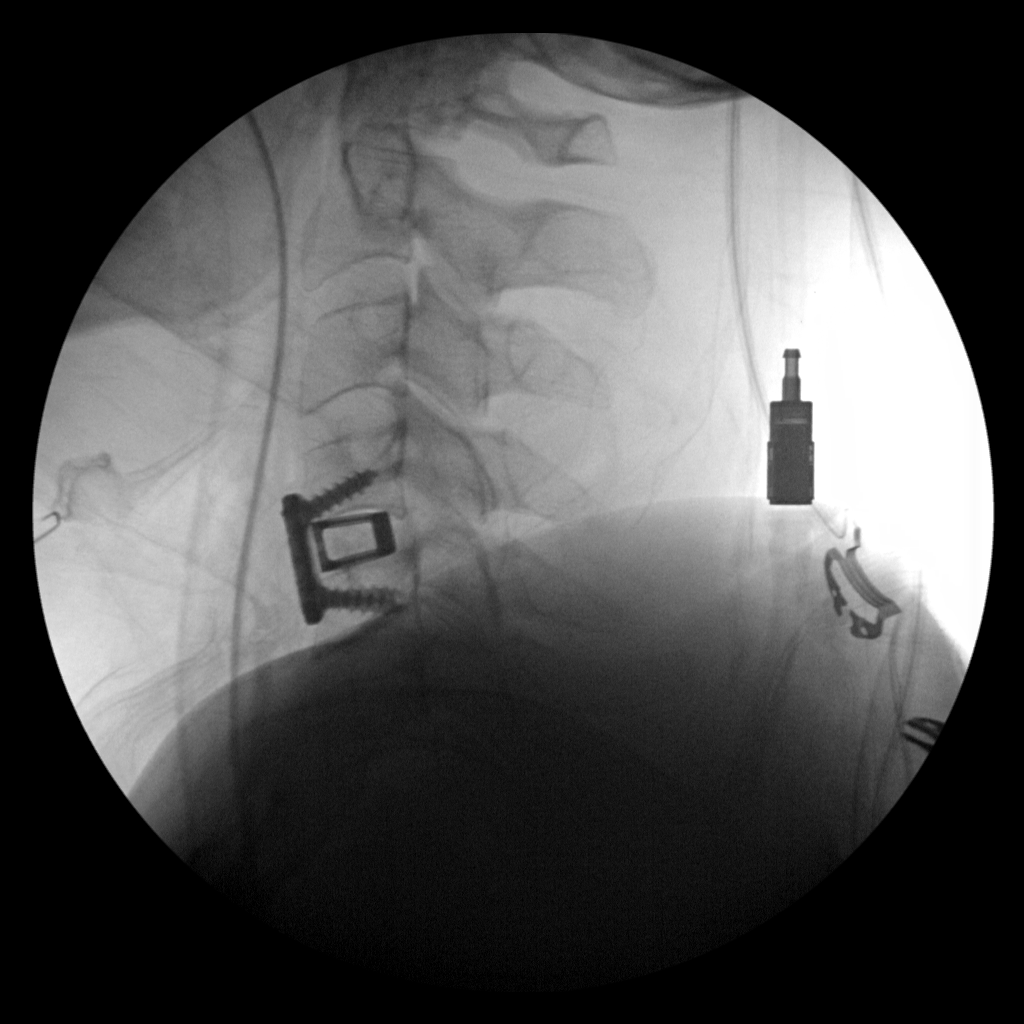
[im 2/2]
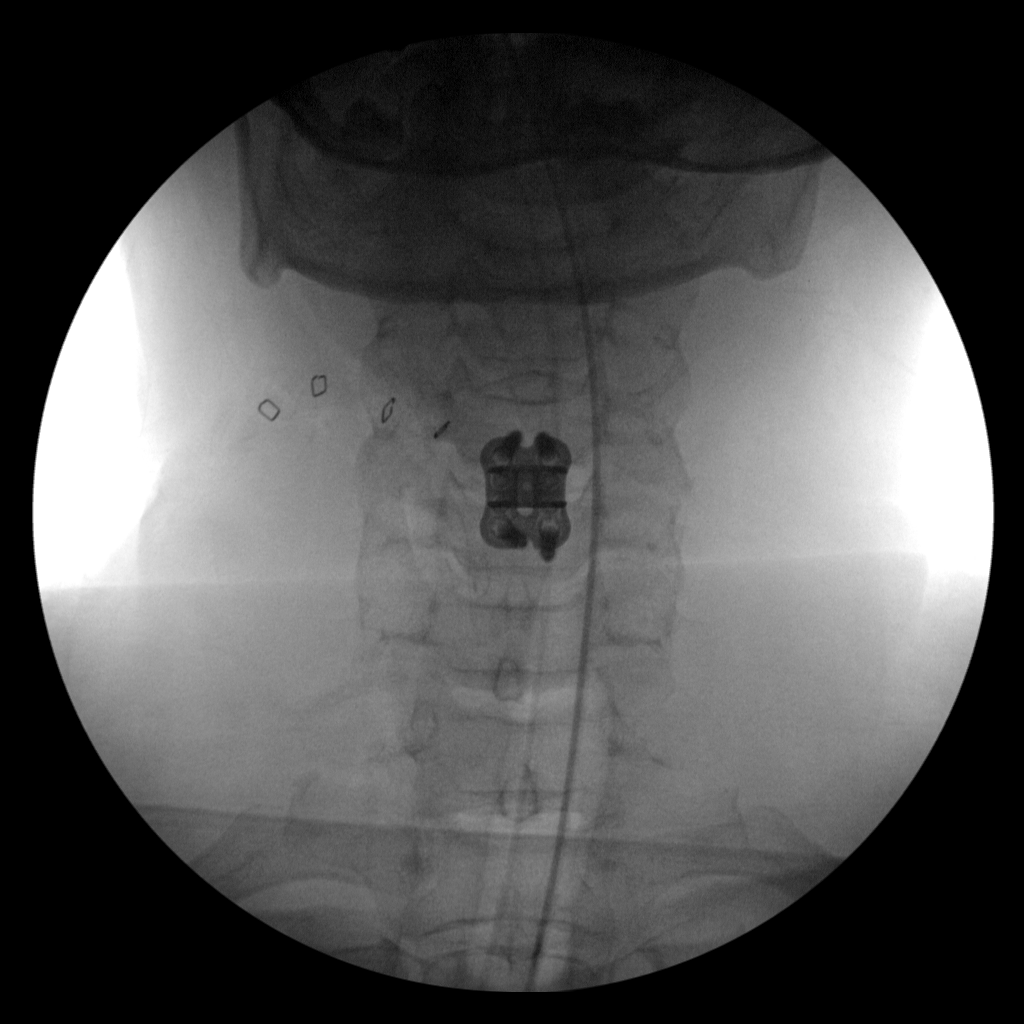

[2 of 2 positions shown; findings below may reference images not displayed]

FINDINGS: 2 intraoperative views the cervical spine are submitted. C4-5 ACDF
is noted. Metallic disc spacer is in place. The disc space is
expanded. Alignment is anatomic. The patient is intubated.
IMPRESSION: C4-5 ACDF without radiographic evidence for complication.

## 2019-09-09 ENCOUNTER — Other Ambulatory Visit: Payer: Self-pay | Admitting: *Deleted

## 2019-09-09 ENCOUNTER — Other Ambulatory Visit: Payer: Self-pay

## 2019-09-09 MED ORDER — FLUTICASONE PROPIONATE 50 MCG/ACT NA SUSP: 2.0000 | Freq: Every day | NASAL | 6 refills | Status: DC

## 2020-09-13 ENCOUNTER — Other Ambulatory Visit: Payer: Self-pay | Admitting: Family Medicine

## 2020-11-23 ENCOUNTER — Other Ambulatory Visit: Payer: Self-pay | Admitting: Family Medicine

## 2021-09-29 ENCOUNTER — Other Ambulatory Visit: Payer: Self-pay

## 2021-09-29 ENCOUNTER — Ambulatory Visit: Payer: BC Managed Care – PPO | Admitting: Podiatry

## 2021-09-29 ENCOUNTER — Telehealth: Payer: Self-pay | Admitting: Podiatry

## 2021-09-29 ENCOUNTER — Encounter: Payer: Self-pay | Admitting: Podiatry

## 2021-09-29 DIAGNOSIS — L03032 Cellulitis of left toe: Secondary | ICD-10-CM

## 2021-09-29 MED ORDER — SULFAMETHOXAZOLE-TRIMETHOPRIM 800-160 MG PO TABS: 1.0000 | ORAL_TABLET | Freq: Two times a day (BID) | ORAL | 1 refills | Status: DC

## 2021-09-29 MED ORDER — SULFAMETHOXAZOLE-TRIMETHOPRIM 800-160 MG PO TABS: 1.0000 | ORAL_TABLET | Freq: Two times a day (BID) | ORAL | 1 refills | Status: AC

## 2021-09-29 NOTE — Progress Notes (Signed)
°  Subjective:  Patient ID: Shaun Wells, male    DOB: [birth year],  MRN: [id number]  Chief Complaint  Patient presents with   Ingrown Toenail    Infected nail bed left great toe x 1 week    40 y.o. male presents with the above complaint. History confirmed with patient.  Has not been on antibiotics he soaked it in Epson salt last night and drained it and some pus came out  Objective:  Physical Exam: warm, good capillary refill, no trophic changes or ulcerative lesions, normal DP and PT pulses, normal sensory exam, and paronychia left great toenail proximal erythema tenderness to touch no deep ingrown or nail abnormality to note. Assessment:   1. Paronychia of great toe of left foot      Plan:  Patient was evaluated and treated and all questions answered.  Has a paronychia that seems to be improving now that he has drained some of the abscess himself.  There is no nail abnormality or ingrown at this point but I think we should remove the nail plate.  I recommended treat with antibiotics and sent him a prescription for Bactrim and will reevaluate in about 10 days if it is still bothering him he will return for total temporary nail plate avulsion  Return if symptoms worsen or fail to improve.

## 2021-09-29 NOTE — Telephone Encounter (Signed)
[facility] on [address]  -   sulfamethoxazole-trimethoprim (BACTRIM DS) 800-160 MG tablet [[id number]]   Needs to be resent to [facility] on [address]  - status say Print ? Pharmacy is saying the did not receive .

## 2021-12-12 ENCOUNTER — Other Ambulatory Visit: Payer: Self-pay | Admitting: Family Medicine

## 2022-01-25 ENCOUNTER — Other Ambulatory Visit: Payer: Self-pay | Admitting: Family Medicine
